# Patient Record
Sex: Female | Born: 1955 | Race: White | Hispanic: No | Smoking: Never smoker
Health system: Southern US, Community
[De-identification: ages and names within clinical notes are randomized; demographics above are authoritative.]

## PROBLEM LIST (undated history)

## (undated) DIAGNOSIS — I10 Essential (primary) hypertension: Secondary | ICD-10-CM

## (undated) DIAGNOSIS — E7800 Pure hypercholesterolemia, unspecified: Secondary | ICD-10-CM

## (undated) DIAGNOSIS — F419 Anxiety disorder, unspecified: Secondary | ICD-10-CM

## (undated) HISTORY — PX: SHOULDER EXAMINAITON UNDER ANESTHESIA W/ INJECTION: SUR467

## (undated) HISTORY — PX: ABDOMINAL HYSTERECTOMY: SHX81

## (undated) HISTORY — PX: VAGINAL HYSTERECTOMY: SUR661

---

## 2019-04-10 DIAGNOSIS — E669 Obesity, unspecified: Secondary | ICD-10-CM | POA: Diagnosis not present

## 2019-04-10 DIAGNOSIS — Z0189 Encounter for other specified special examinations: Secondary | ICD-10-CM | POA: Diagnosis not present

## 2019-04-10 DIAGNOSIS — I10 Essential (primary) hypertension: Secondary | ICD-10-CM | POA: Diagnosis not present

## 2019-04-10 DIAGNOSIS — R7301 Impaired fasting glucose: Secondary | ICD-10-CM | POA: Diagnosis not present

## 2019-04-10 DIAGNOSIS — E785 Hyperlipidemia, unspecified: Secondary | ICD-10-CM | POA: Diagnosis not present

## 2019-04-17 DIAGNOSIS — E782 Mixed hyperlipidemia: Secondary | ICD-10-CM | POA: Diagnosis not present

## 2019-04-17 DIAGNOSIS — I129 Hypertensive chronic kidney disease with stage 1 through stage 4 chronic kidney disease, or unspecified chronic kidney disease: Secondary | ICD-10-CM | POA: Diagnosis not present

## 2019-04-17 DIAGNOSIS — Z0000 Encounter for general adult medical examination without abnormal findings: Secondary | ICD-10-CM | POA: Diagnosis not present

## 2019-04-17 DIAGNOSIS — N182 Chronic kidney disease, stage 2 (mild): Secondary | ICD-10-CM | POA: Diagnosis not present

## 2019-10-17 DIAGNOSIS — E782 Mixed hyperlipidemia: Secondary | ICD-10-CM | POA: Diagnosis not present

## 2019-10-17 DIAGNOSIS — E785 Hyperlipidemia, unspecified: Secondary | ICD-10-CM | POA: Diagnosis not present

## 2019-10-17 DIAGNOSIS — N182 Chronic kidney disease, stage 2 (mild): Secondary | ICD-10-CM | POA: Diagnosis not present

## 2019-10-17 DIAGNOSIS — I10 Essential (primary) hypertension: Secondary | ICD-10-CM | POA: Diagnosis not present

## 2019-10-20 DIAGNOSIS — D225 Melanocytic nevi of trunk: Secondary | ICD-10-CM | POA: Diagnosis not present

## 2019-10-20 DIAGNOSIS — I129 Hypertensive chronic kidney disease with stage 1 through stage 4 chronic kidney disease, or unspecified chronic kidney disease: Secondary | ICD-10-CM | POA: Diagnosis not present

## 2019-10-20 DIAGNOSIS — Z85828 Personal history of other malignant neoplasm of skin: Secondary | ICD-10-CM | POA: Diagnosis not present

## 2019-10-20 DIAGNOSIS — D1801 Hemangioma of skin and subcutaneous tissue: Secondary | ICD-10-CM | POA: Diagnosis not present

## 2019-10-20 DIAGNOSIS — L821 Other seborrheic keratosis: Secondary | ICD-10-CM | POA: Diagnosis not present

## 2019-10-20 DIAGNOSIS — Z0000 Encounter for general adult medical examination without abnormal findings: Secondary | ICD-10-CM | POA: Diagnosis not present

## 2019-10-20 DIAGNOSIS — N182 Chronic kidney disease, stage 2 (mild): Secondary | ICD-10-CM | POA: Diagnosis not present

## 2019-10-20 DIAGNOSIS — E782 Mixed hyperlipidemia: Secondary | ICD-10-CM | POA: Diagnosis not present

## 2020-05-17 ENCOUNTER — Ambulatory Visit
Admission: EM | Admit: 2020-05-17 | Discharge: 2020-05-17 | Disposition: A | Discharge status: Home / Self Care | Payer: Medicare Other | Source: Home / Self Care

## 2020-05-17 ENCOUNTER — Other Ambulatory Visit: Payer: Self-pay

## 2020-05-17 ENCOUNTER — Encounter (HOSPITAL_COMMUNITY): Payer: Self-pay

## 2020-05-17 ENCOUNTER — Emergency Department (HOSPITAL_COMMUNITY)
Admission: EM | Admit: 2020-05-17 | Discharge: 2020-05-18 | Disposition: A | Discharge status: Home / Self Care | Payer: Medicare Other | Attending: Emergency Medicine | Admitting: Emergency Medicine

## 2020-05-17 DIAGNOSIS — R0789 Other chest pain: Secondary | ICD-10-CM | POA: Diagnosis not present

## 2020-05-17 DIAGNOSIS — I10 Essential (primary) hypertension: Secondary | ICD-10-CM | POA: Diagnosis not present

## 2020-05-17 DIAGNOSIS — F419 Anxiety disorder, unspecified: Secondary | ICD-10-CM | POA: Insufficient documentation

## 2020-05-17 DIAGNOSIS — Z79899 Other long term (current) drug therapy: Secondary | ICD-10-CM | POA: Diagnosis not present

## 2020-05-17 DIAGNOSIS — R4589 Other symptoms and signs involving emotional state: Secondary | ICD-10-CM

## 2020-05-17 DIAGNOSIS — R6889 Other general symptoms and signs: Secondary | ICD-10-CM | POA: Diagnosis not present

## 2020-05-17 HISTORY — DX: Essential (primary) hypertension: I10

## 2020-05-17 NOTE — Discharge Instructions (Signed)
Recommending further evaluation and management in the ED for "not feeling well, feeling like I'm going to pass out."  Would like to deferred testing to ED including EKG.  Patient and family aware of plan and in agreement.  Will travel by private vehicle to [facility] ED.

## 2020-05-17 NOTE — ED Triage Notes (Signed)
Pt was seen by PCP  on 4/29 for similar episode and had lab work done. Pt states that that she begins feeling bad after eating and reports both arms feel tingly. Pt reports her husband passed away 2 months ago

## 2020-05-17 NOTE — ED Triage Notes (Signed)
Pt states she went to urgent care earlier today for "General ill feeling",  States they did an ekg and was told to come to e.d. if she desired further treatment and testing.   Pt has multiple complaints, states she felt like she was going to pass out, got tingling feeling in both arms.  Pt denies pain at this time.  Pt states she has had a lot of stress recently due to losing her husband.

## 2020-05-17 NOTE — ED Provider Notes (Signed)
[facility]   [id number] 05/17/20 Arrival Time: [id number]  CC: "not feeling well"  SUBJECTIVE: HPI: Patient is a poor historian Haley Morrow is a 64 y.o. female who complains of not feeling good, and feeling like she was going to pass out earlier today.  Also mentions bilateral arms tingling and numbness that has occurred 4x over the past 2 years.  Has been seen by PCP for this problem and had a negative work-up.  Told symptoms may be related to anxiety.  Husband passed away 2 months ago.  Currently not taking medications.  Symptoms are made worse with eating.  Complains of associate belching with eating.  Patient denies pain, fever, chills, nausea, vomiting, chest pain, SOB, abdominal pain, changes in bowel or bladder habits.    ROS: As per HPI.  All other pertinent ROS negative.     Past Medical History:  Diagnosis Date  . Hypertension    Past Surgical History:  Procedure Laterality Date  . ABDOMINAL HYSTERECTOMY    . SHOULDER EXAMINAITON UNDER ANESTHESIA W/ INJECTION Left    No Known Allergies No current facility-administered medications on file prior to encounter.   Current Outpatient Medications on File Prior to Encounter  Medication Sig Dispense Refill  . losartan (COZAAR) 100 MG tablet Take 100 mg by mouth daily.    . rosuvastatin (CRESTOR) 10 MG tablet Take 10 mg by mouth at bedtime.     Social History   Socioeconomic History  . Marital status: Widowed    Spouse name: Not on file  . Number of children: Not on file  . Years of education: Not on file  . Highest education level: Not on file  Occupational History  . Not on file  Tobacco Use  . Smoking status: Never Smoker  . Smokeless tobacco: Never Used  Substance and Sexual Activity  . Alcohol use: Never  . Drug use: Never  . Sexual activity: Not on file  Other Topics Concern  . Not on file  Social History Narrative  . Not on file   Social Determinants of Health   Financial Resource Strain:   .  Difficulty of Paying Living Expenses:   Food Insecurity:   . Worried About Charity fundraiser in the Last Year:   . Arboriculturist in the Last Year:   Transportation Needs:   . Film/video editor (Medical):   Marland Kitchen Lack of Transportation (Non-Medical):   Physical Activity:   . Days of Exercise per Week:   . Minutes of Exercise per Session:   Stress:   . Feeling of Stress :   Social Connections:   . Frequency of Communication with Friends and Family:   . Frequency of Social Gatherings with Friends and Family:   . Attends Religious Services:   . Active Member of Clubs or Organizations:   . Attends Archivist Meetings:   Marland Kitchen Marital Status:   Intimate Partner Violence:   . Fear of Current or Ex-Partner:   . Emotionally Abused:   Marland Kitchen Physically Abused:   . Sexually Abused:    Family History  Problem Relation Age of Onset  . CAD Mother   . Cancer Mother   . Diabetes Father   . Stroke Father     OBJECTIVE:  Vitals:   05/17/20 1618  BP: 126/73  Pulse: 94  Resp: 18  Temp: 98.2 F (36.8 C)  SpO2: 98%    General appearance: alert; no distress Eyes: PERRLA; EOMI HENT:  normocephalic; atraumatic Neck: supple with FROM Lungs: clear to auscultation bilaterally Heart: regular rate and rhythm.   Extremities: no edema; symmetrical with no gross deformities Skin: warm and dry Neurologic: CN 2-12 grossly intact; finger to nose without difficulty; normal gait; strength and sensation intact bilaterally about the upper and lower extremities; negative pronator drift Psychological: alert and cooperative; normal mood and affect  ASSESSMENT & PLAN:  1. Sense of impending doom   2. General ill feeling    Recommending further evaluation and management in the ED for "not feeling well, feeling like I'm going to pass out."  Would like to deferred testing to ED including EKG.  Patient and family aware of plan and in agreement.  Will travel by private vehicle to [facility] ED.       Lestine Box, PA-C 05/17/20 1743

## 2020-05-18 ENCOUNTER — Emergency Department (HOSPITAL_COMMUNITY): Payer: Medicare Other

## 2020-05-18 LAB — COMPREHENSIVE METABOLIC PANEL
ALT: 14 U/L (ref 0–44)
AST: 15 U/L (ref 15–41)
Albumin: 4 g/dL (ref 3.5–5.0)
Alkaline Phosphatase: 64 U/L (ref 38–126)
Anion gap: 11 (ref 5–15)
BUN: 11 mg/dL (ref 8–23)
CO2: 24 mmol/L (ref 22–32)
Calcium: 8.7 mg/dL — ABNORMAL LOW (ref 8.9–10.3)
Chloride: 105 mmol/L (ref 98–111)
Creatinine, Ser: 0.72 mg/dL (ref 0.44–1.00)
GFR calc Af Amer: >60 mL/min (ref >60)
GFR calc non Af Amer: >60 mL/min (ref >60)
Glucose, Bld: 105 mg/dL — ABNORMAL HIGH (ref 70–99)
Potassium: 3.4 mmol/L — ABNORMAL LOW (ref 3.5–5.1)
Sodium: 140 mmol/L (ref 135–145)
Total Bilirubin: 0.8 mg/dL (ref 0.3–1.2)
Total Protein: 6.6 g/dL (ref 6.5–8.1)

## 2020-05-18 LAB — CBC WITH DIFFERENTIAL/PLATELET
Abs Immature Granulocytes: 0.02 10*3/uL (ref 0.00–0.07)
Basophils Absolute: 0.1 10*3/uL (ref 0.0–0.1)
Basophils Relative: 1 %
Eosinophils Absolute: 0.1 10*3/uL (ref 0.0–0.5)
Eosinophils Relative: 1 %
HCT: 41.7 % (ref 36.0–46.0)
Hemoglobin: 14.4 g/dL (ref 12.0–15.0)
Immature Granulocytes: 0 %
Lymphocytes Relative: 26 %
Lymphs Abs: 2 10*3/uL (ref 0.7–4.0)
MCH: 32.2 pg (ref 26.0–34.0)
MCHC: 34.5 g/dL (ref 30.0–36.0)
MCV: 93.3 fL (ref 80.0–100.0)
Monocytes Absolute: 0.6 10*3/uL (ref 0.1–1.0)
Monocytes Relative: 8 %
Neutro Abs: 5 10*3/uL (ref 1.7–7.7)
Neutrophils Relative %: 64 %
Platelets: 237 10*3/uL (ref 150–400)
RBC: 4.47 MIL/uL (ref 3.87–5.11)
RDW: 12.3 % (ref 11.5–15.5)
WBC: 7.8 10*3/uL (ref 4.0–10.5)
nRBC: 0 % (ref 0.0–0.2)

## 2020-05-18 LAB — LIPASE, BLOOD: Lipase: 21 U/L (ref 11–51)

## 2020-05-18 LAB — TROPONIN I (HIGH SENSITIVITY)
Troponin I (High Sensitivity): 3 ng/L (ref <18)
Troponin I (High Sensitivity): 4 ng/L (ref <18)

## 2020-05-18 NOTE — ED Provider Notes (Signed)
[facility] EMERGENCY DEPARTMENT Provider Note   CSN: [id number] Arrival date & time: 05/17/20  2318     History Chief Complaint  Patient presents with  . Anxiety    Haley Morrow is a 64 y.o. female.  Patient with a history of hypertension and high cholesterol and seasonal allergies presenting at the request of urgent care.  States she went to urgent care earlier today with feeling unwell and anxious.  She woke up this morning having to deal with some of her husband's affairs.  Her husband passed away about 2 months ago.  She began to feel anxious this morning and felt like she was "going to pass out".  She had intermittent "cold sensation" in her bilateral upper arms coming and going lasting for few minutes at a time.  There is no chest pain or shortness of breath.  There is no focal weakness, numbness or tingling.  She reports having a brief sensation of epigastric pain and fullness earlier today which is since resolved.  Never did have chest pain.  No nausea, vomiting, diaphoresis, fever or cough.  Denies any cardiac history.  Reports having a stress test about 4 to 5 years ago that was negative.  She denies any suicidal or homicidal thoughts.  She is not hearing any voices.  Denies any alcohol or drug abuse.  Patient has had similar episodes in the past with a cool sensation in her extremities and been diagnosed with anxiety.  Does not take an anxiety medication.  She was told to come to the ED by urgent care because of some nonspecific changes on her EKG.  The history is provided by the patient.  Anxiety Associated symptoms include abdominal pain. Pertinent negatives include no chest pain, no headaches and no shortness of breath.       Past Medical History:  Diagnosis Date  . Hypertension     There are no problems to display for this patient.   Past Surgical History:  Procedure Laterality Date  . ABDOMINAL HYSTERECTOMY    . SHOULDER EXAMINAITON UNDER ANESTHESIA W/  INJECTION Left      OB History   No obstetric history on file.     Family History  Problem Relation Age of Onset  . CAD Mother   . Cancer Mother   . Diabetes Father   . Stroke Father     Social History   Tobacco Use  . Smoking status: Never Smoker  . Smokeless tobacco: Never Used  Substance Use Topics  . Alcohol use: Never  . Drug use: Never    Home Medications Prior to Admission medications   Medication Sig Start Date End Date Taking? Authorizing Provider  losartan (COZAAR) 100 MG tablet Take 100 mg by mouth daily. 04/26/20   [provider]  rosuvastatin (CRESTOR) 10 MG tablet Take 10 mg by mouth at bedtime. 04/26/20   [provider]    Allergies    Patient has no known allergies.  Review of Systems   Review of Systems  Constitutional: Negative for activity change, appetite change, fatigue and fever.  HENT: Negative for congestion and rhinorrhea.   Eyes: Negative for visual disturbance.  Respiratory: Negative for cough, chest tightness and shortness of breath.   Cardiovascular: Negative for chest pain.  Gastrointestinal: Positive for abdominal pain.  Genitourinary: Negative for dysuria and hematuria.  Musculoskeletal: Negative for arthralgias and myalgias.  Skin: Negative for rash.  Neurological: Positive for light-headedness. Negative for dizziness, weakness and headaches.  Psychiatric/Behavioral:  The patient is nervous/anxious.    all other systems are negative except as noted in the HPI and PMH.    Physical Exam Updated Vital Signs BP (!) 161/85 (BP Location: Right Arm)   Pulse 83   Temp 98.1 F (36.7 C) (Oral)   Resp 17   Ht 5\' 3"  (1.6 m)   Wt 77.7 kg   SpO2 99%   BMI 30.34 kg/m   Physical Exam Vitals and nursing note reviewed.  Constitutional:      General: She is not in acute distress.    Appearance: She is well-developed.     Comments: Anxious appearing  HENT:     Head: Normocephalic and atraumatic.     Mouth/Throat:      Pharynx: No oropharyngeal exudate.  Eyes:     Conjunctiva/sclera: Conjunctivae normal.     Pupils: Pupils are equal, round, and reactive to light.  Neck:     Comments: No meningismus. Cardiovascular:     Rate and Rhythm: Normal rate and regular rhythm.     Heart sounds: Normal heart sounds. No murmur.  Pulmonary:     Effort: Pulmonary effort is normal. No respiratory distress.     Breath sounds: Normal breath sounds.  Abdominal:     Palpations: Abdomen is soft.     Tenderness: There is no abdominal tenderness. There is no guarding or rebound.  Musculoskeletal:        General: No tenderness. Normal range of motion.     Cervical back: Normal range of motion and neck supple.  Skin:    General: Skin is warm.     Capillary Refill: Capillary refill takes less than 2 seconds.     Findings: No erythema or rash.  Neurological:     General: No focal deficit present.     Mental Status: She is alert and oriented to person, place, and time. Mental status is at baseline.     Cranial Nerves: No cranial nerve deficit.     Motor: No abnormal muscle tone.     Coordination: Coordination normal.     Comments:  5/5 strength throughout. CN 2-12 intact.Equal grip strength.   Psychiatric:        Behavior: Behavior normal.     ED Results / Procedures / Treatments   Labs (all labs ordered are listed, but only abnormal results are displayed) Labs Reviewed  COMPREHENSIVE METABOLIC PANEL - Abnormal; Notable for the following components:      Result Value   Potassium 3.4 (*)    Glucose, Bld 105 (*)    Calcium 8.7 (*)    All other components within normal limits  CBC WITH DIFFERENTIAL/PLATELET  LIPASE, BLOOD  TROPONIN I (HIGH SENSITIVITY)  TROPONIN I (HIGH SENSITIVITY)    EKG None    Radiology DG Chest 2 View  Result Date: 05/18/2020 CLINICAL DATA:  Chest pain EXAM: CHEST - 2 VIEW COMPARISON:  None. FINDINGS: The heart size and mediastinal contours are within normal limits. Both lungs are  clear. The visualized skeletal structures are unremarkable. IMPRESSION: No active cardiopulmonary disease. Electronically Signed   By: Prudencio Pair M.D.   On: 05/18/2020 01:50    Procedures Procedures (including critical care time)  Medications Ordered in ED Medications - No data to display  ED Course  I have reviewed the triage vital signs and the nursing notes.  Pertinent labs & imaging results that were available during my care of the patient were reviewed by me and considered in my medical  decision making (see chart for details).    MDM Rules/Calculators/A&P                     Patient from urgent care with feeling anxious, cold sensation on bilateral upper extremities, lightheadedness, intermittent epigastric pain which has since resolved.  No chest pain or shortness of breath.  Her EKG from urgent care shows nonspecific ST changes with no comparison.  Labs are reassuring.  EKG is nonischemic.  Troponin is negative.  Patient does appear quite anxious.  She states the sensation of the cold feeling in her arms has resolved.  She has no further epigastric pain. She has intact distal pulses and grip strength  Troponin remains negative x2. No difficulty breathing or difficulty swallowing.  Low suspicion for ACS, PE, or dissection.  Patient appears stable to follow-up with her PCP. Bilateral arm sensation is atypical for stroke or TIA.  Patient feels back to baseline.  No dizziness or lightheadedness.  No chest pain or shortness of breath.  Reassuring that her EKG is sinus rhythm and troponins remain negative x2.  Low suspicion for ACS.  Suspect component of anxiety contributing to her symptoms. Establish care with PCP.  Return precautions discussed including exertional chest pain, shortness of breath, nausea, vomiting, diaphoresis or other concerns.    ED ECG REPORT   Date: 05/18/2020  Rate: 67  Rhythm: normal sinus rhythm  QRS Axis: normal  Intervals: normal  ST/T Wave  abnormalities: normal  Conduction Disutrbances:none  Narrative Interpretation:   Old EKG Reviewed: unchanged  I have personally reviewed the EKG tracing and agree with the computerized printout as noted.  Final Clinical Impression(s) / ED Diagnoses Final diagnoses:  Anxiety  Atypical chest pain    Rx / DC Orders ED Discharge Orders    None       Job Holtsclaw, Annie Main, MD 05/18/20 [phone]

## 2020-05-18 NOTE — Discharge Instructions (Signed)
Your testing is reassuring.  Follow-up with your doctor.  Return to the ED if your chest pain becomes exertional, associated shortness of breath, nausea, vomiting, sweating, any other concerns.

## 2020-05-25 ENCOUNTER — Other Ambulatory Visit: Payer: Self-pay

## 2020-05-25 ENCOUNTER — Emergency Department (HOSPITAL_COMMUNITY): Payer: Medicare Other

## 2020-05-25 ENCOUNTER — Encounter (HOSPITAL_COMMUNITY): Payer: Self-pay | Admitting: Emergency Medicine

## 2020-05-25 ENCOUNTER — Emergency Department (HOSPITAL_COMMUNITY)
Admission: EM | Admit: 2020-05-25 | Discharge: 2020-05-25 | Disposition: A | Discharge status: Home / Self Care | Payer: Medicare Other | Attending: Emergency Medicine | Admitting: Emergency Medicine

## 2020-05-25 DIAGNOSIS — R1013 Epigastric pain: Secondary | ICD-10-CM | POA: Insufficient documentation

## 2020-05-25 DIAGNOSIS — R911 Solitary pulmonary nodule: Secondary | ICD-10-CM | POA: Diagnosis not present

## 2020-05-25 DIAGNOSIS — R072 Precordial pain: Secondary | ICD-10-CM

## 2020-05-25 DIAGNOSIS — I10 Essential (primary) hypertension: Secondary | ICD-10-CM | POA: Diagnosis not present

## 2020-05-25 HISTORY — DX: Pure hypercholesterolemia, unspecified: E78.00

## 2020-05-25 LAB — CBC WITH DIFFERENTIAL/PLATELET
Abs Immature Granulocytes: 0.01 10*3/uL (ref 0.00–0.07)
Basophils Absolute: 0 10*3/uL (ref 0.0–0.1)
Basophils Relative: 1 %
Eosinophils Absolute: 0 10*3/uL (ref 0.0–0.5)
Eosinophils Relative: 0 %
HCT: 43.7 % (ref 36.0–46.0)
Hemoglobin: 15.4 g/dL — ABNORMAL HIGH (ref 12.0–15.0)
Immature Granulocytes: 0 %
Lymphocytes Relative: 16 %
Lymphs Abs: 1.3 10*3/uL (ref 0.7–4.0)
MCH: 32.4 pg (ref 26.0–34.0)
MCHC: 35.2 g/dL (ref 30.0–36.0)
MCV: 91.8 fL (ref 80.0–100.0)
Monocytes Absolute: 0.5 10*3/uL (ref 0.1–1.0)
Monocytes Relative: 6 %
Neutro Abs: 5.9 10*3/uL (ref 1.7–7.7)
Neutrophils Relative %: 77 %
Platelets: 277 10*3/uL (ref 150–400)
RBC: 4.76 MIL/uL (ref 3.87–5.11)
RDW: 12.3 % (ref 11.5–15.5)
WBC: 7.7 10*3/uL (ref 4.0–10.5)
nRBC: 0 % (ref 0.0–0.2)

## 2020-05-25 LAB — HEPATIC FUNCTION PANEL
ALT: 14 U/L (ref 0–44)
AST: 17 U/L (ref 15–41)
Albumin: 4.3 g/dL (ref 3.5–5.0)
Alkaline Phosphatase: 67 U/L (ref 38–126)
Bilirubin, Direct: 0.1 mg/dL (ref 0.0–0.2)
Indirect Bilirubin: 0.7 mg/dL (ref 0.3–0.9)
Total Bilirubin: 0.8 mg/dL (ref 0.3–1.2)
Total Protein: 7.1 g/dL (ref 6.5–8.1)

## 2020-05-25 LAB — BASIC METABOLIC PANEL
Anion gap: 12 (ref 5–15)
BUN: 8 mg/dL (ref 8–23)
CO2: 23 mmol/L (ref 22–32)
Calcium: 9.2 mg/dL (ref 8.9–10.3)
Chloride: 106 mmol/L (ref 98–111)
Creatinine, Ser: 0.72 mg/dL (ref 0.44–1.00)
GFR calc Af Amer: >60 mL/min (ref >60)
GFR calc non Af Amer: >60 mL/min (ref >60)
Glucose, Bld: 138 mg/dL — ABNORMAL HIGH (ref 70–99)
Potassium: 3.8 mmol/L (ref 3.5–5.1)
Sodium: 141 mmol/L (ref 135–145)

## 2020-05-25 LAB — TROPONIN I (HIGH SENSITIVITY): Troponin I (High Sensitivity): 3 ng/L (ref <18)

## 2020-05-25 LAB — LIPASE, BLOOD: Lipase: 23 U/L (ref 11–51)

## 2020-05-25 MED ORDER — IOHEXOL 300 MG/ML  SOLN
100.0000 mL | Freq: Once | INTRAMUSCULAR | Status: AC | PRN
  Administered 2020-05-25: 100 mL via INTRAVENOUS

## 2020-05-25 MED ORDER — PANTOPRAZOLE SODIUM 40 MG PO TBEC
40.0000 mg | DELAYED_RELEASE_TABLET | Freq: Every day | ORAL | 0 refills | Status: DC
Start: 2020-05-25 — End: 2020-05-27

## 2020-05-25 NOTE — ED Provider Notes (Signed)
Emergency Department Provider Note   I have reviewed the triage vital signs and the nursing notes.   HISTORY  Chief Complaint Abdominal Pain   HPI Haley Morrow is a 64 y.o. female with HLD and HTN presents to the ED with returns to the emergency department with episodic symptoms largely blamed on anxiety to this point.  Patient has been seen in the emergency department as well as her primary care doctor's office.  She lost her husband 2 months ago and states that she has been grieving appropriately but does not feel overly anxious, depressed.  She reports episodes where she will have burning across her chest without shortness of breath.  No fever or cough.  Symptoms seem to arise out of nowhere.  She has more persistent epigastric abdominal discomfort.  She states it feels like a "gnawing" or "tightness" with occasional radiation up into the chest.  No diaphoresis, nausea, vomiting.  No diarrhea.  No abdominal or back/flank pain.  She was prescribed Zoloft which she has taken twice but after reviewing some of the side effects is not sure if she will continue taking it. Denies prior history of ACS.   Past Medical History:  Diagnosis Date  . High cholesterol   . Hypertension     There are no problems to display for this patient.   Past Surgical History:  Procedure Laterality Date  . ABDOMINAL HYSTERECTOMY    . SHOULDER EXAMINAITON UNDER ANESTHESIA W/ INJECTION Left     Allergies Patient has no known allergies.  Family History  Problem Relation Age of Onset  . CAD Mother   . Cancer Mother   . Diabetes Father   . Stroke Father     Social History Social History   Tobacco Use  . Smoking status: Never Smoker  . Smokeless tobacco: Never Used  Substance Use Topics  . Alcohol use: Never  . Drug use: Never    Review of Systems  Constitutional: No fever/chills Eyes: No visual changes. ENT: No sore throat. Cardiovascular: Positive chest pain. Respiratory: Denies  shortness of breath. Gastrointestinal: Positive epigastric abdominal pain.  No nausea, no vomiting.  No diarrhea.  No constipation. Genitourinary: Negative for dysuria. Musculoskeletal: Negative for back pain. Skin: Negative for rash. Neurological: Negative for headaches, focal weakness or numbness.  10-point ROS otherwise negative.  ____________________________________________   PHYSICAL EXAM:  VITAL SIGNS: ED Triage Vitals  Enc Vitals Group     BP 05/25/20 1318 (!) 174/84     Pulse Rate 05/25/20 1318 87     Resp 05/25/20 1318 18     Temp 05/25/20 1318 98.4 F (36.9 C)     Temp Source 05/25/20 1318 Oral     SpO2 05/25/20 1318 98 %     Weight 05/25/20 1319 171 lb 4.8 oz (77.7 kg)     Height 05/25/20 1319 5\' 3"  (1.6 m)   Constitutional: Alert and oriented. Well appearing and in no acute distress. Eyes: Conjunctivae are normal.  Head: Atraumatic. Nose: No congestion/rhinnorhea. Mouth/Throat: Mucous membranes are moist.   Neck: No stridor.  Cardiovascular: Normal rate, regular rhythm. Good peripheral circulation. Grossly normal heart sounds.   Respiratory: Normal respiratory effort.  No retractions. Lungs CTAB. Gastrointestinal: Soft and nontender. No RUQ tenderness. No rebound or guarding. No distention.  Musculoskeletal: No gross deformities of extremities. Neurologic:  Normal speech and language.  Skin:  Skin is warm, dry and intact. No rash noted.  ____________________________________________   LABS (all labs ordered are listed,  but only abnormal results are displayed)  Labs Reviewed  CBC WITH DIFFERENTIAL/PLATELET - Abnormal; Notable for the following components:      Result Value   Hemoglobin 15.4 (*)    All other components within normal limits  BASIC METABOLIC PANEL - Abnormal; Notable for the following components:   Glucose, Bld 138 (*)    All other components within normal limits  HEPATIC FUNCTION PANEL  LIPASE, BLOOD  TROPONIN I (HIGH SENSITIVITY)    ____________________________________________  EKG   EKG Interpretation  Date/Time:  Saturday May 25 2020 13:15:35 EDT Ventricular Rate:  88 PR Interval:    QRS Duration: 81 QT Interval:  346 QTC Calculation: 419 R Axis:   22 Text Interpretation: Sinus rhythm Abnormal R-wave progression, early transition Borderline ST depression, anterolateral leads Baseline wander in lead(s) II aVR aVF No STEMI Confirmed by Nanda Quinton [phone]) on 05/25/2020 1:26:08 PM       ____________________________________________  RADIOLOGY  DG Chest 2 View  Result Date: 05/25/2020 CLINICAL DATA:  PT c/o continued epigastric pain worsening after eating x9 days. PT states normal BM this am and denies any urinary symptoms or n/v/d. PT states at times pain feels like it radiates up into her chest but not currently. EXAM: CHEST - 2 VIEW COMPARISON:  05/18/2020 FINDINGS: Lungs are clear. Heart size and mediastinal contours are within normal limits. No effusion.  No pneumothorax. Visualized bones unremarkable. IMPRESSION: No acute cardiopulmonary disease. Electronically Signed   By: Lucrezia Europe M.D.   On: 05/25/2020 15:07   CT ABDOMEN PELVIS W CONTRAST  Result Date: 05/25/2020 CLINICAL DATA:  Epigastric pain which is worse after eating x9 days. EXAM: CT ABDOMEN AND PELVIS WITH CONTRAST TECHNIQUE: Multidetector CT imaging of the abdomen and pelvis was performed using the standard protocol following bolus administration of intravenous contrast. CONTRAST:  142mL OMNIPAQUE IOHEXOL 300 MG/ML  SOLN COMPARISON:  None. FINDINGS: Lower chest: A 5 mm noncalcified lung nodule is seen within the posteromedial aspect of the right lung base. Hepatobiliary: No focal liver abnormality is seen. No gallstones, gallbladder wall thickening, or biliary dilatation. Pancreas: Unremarkable. No pancreatic ductal dilatation or surrounding inflammatory changes. Spleen: Normal in size without focal abnormality. Adrenals/Urinary Tract: Adrenal glands  are unremarkable. Kidneys are normal in size, without renal calculi or hydronephrosis. Small bilateral parapelvic renal cysts are noted. Bladder is unremarkable. Stomach/Bowel: Stomach is within normal limits. Appendix appears normal. No evidence of bowel wall thickening, distention, or inflammatory changes. Noninflamed diverticula are seen throughout the sigmoid colon. Vascular/Lymphatic: There is mild to moderate severity calcification of the abdominal aorta. No enlarged abdominal or pelvic lymph nodes. Reproductive: Status post hysterectomy. No adnexal masses. Other: No abdominal wall hernia or abnormality. No abdominopelvic ascites. Musculoskeletal: Mild multilevel degenerative changes seen throughout the lumbar spine. IMPRESSION: 1. 5 mm noncalcified lung nodule within the posteromedial aspect of the right lung base. Correlation with follow-up chest CT is recommended in 12 months. 2. Noninflamed sigmoid diverticulosis. 3. Aortic atherosclerosis. Aortic Atherosclerosis (ICD10-I70.0). Electronically Signed   By: Virgina Norfolk M.D.   On: 05/25/2020 15:12    ____________________________________________   PROCEDURES  Procedure(s) performed:   Procedures  None  ____________________________________________   INITIAL IMPRESSION / ASSESSMENT AND PLAN / ED COURSE  Pertinent labs & imaging results that were available during my care of the patient were reviewed by me and considered in my medical decision making (see chart for details).   Patient presents emergency department with symptoms of chest discomfort but her symptomatology mainly  seems to be coming from her epigastric abdominal discomfort.  She has frequent belching and burning pain radiating up into her chest.  She will likely need outpatient GI follow-up.  Plan for CT abdomen pelvis along with screening lab work.  Reviewed the labs and chest x-ray from her prior ED visit which are normal.  Patient does not have any suicidal ideation.  She  does not appear clinically depressed to me and talks about other things in her life which she is excited for including the recent birth of her grandchild.   CT imaging and lab work reviewed.  EKG interpreted as above.  Plan for GI referral which was placed in the system.  Contact information given at discharge.  We will start the patient on Protonix and have her continue to follow closely with her PCP. Discussed ED return precautions. Discussed the incidental finding of pulmonary nodule and recommended 1 year follow-up CT. patient to discuss with her PCP.  ____________________________________________  FINAL CLINICAL IMPRESSION(S) / ED DIAGNOSES  Final diagnoses:  Epigastric pain  Precordial chest pain  Pulmonary nodule     MEDICATIONS GIVEN DURING THIS VISIT:  Medications  iohexol (OMNIPAQUE) 300 MG/ML solution 100 mL (100 mLs Intravenous Contrast Given 05/25/20 1441)     NEW OUTPATIENT MEDICATIONS STARTED DURING THIS VISIT:  Discharge Medication List as of 05/25/2020  3:34 PM    START taking these medications   Details  pantoprazole (PROTONIX) 40 MG tablet Take 1 tablet (40 mg total) by mouth daily., Starting Sat 05/25/2020, Until Mon 06/24/2020, Normal        Note:  This document was prepared using Dragon voice recognition software and may include unintentional dictation errors.  Nanda Quinton, MD, [facility] Emergency Medicine    Wynona Duhamel, [facility], MD 05/26/20 [phone]

## 2020-05-25 NOTE — ED Notes (Signed)
Returned from CT.

## 2020-05-25 NOTE — Discharge Instructions (Addendum)
You have been seen in the Emergency Department (ED) for abdominal pain.  Your evaluation did not identify a clear cause of your symptoms but was generally reassuring.  Please follow up as instructed above regarding today's emergent visit and the symptoms that are bothering you.  I have placed a referral to the gastroenterologist.  Please start the medicines for reflux provided here and return to the emergency department any new or suddenly worsening symptoms especially chest pain.  Return to the ED if your abdominal pain worsens or fails to improve, you develop bloody vomiting, bloody diarrhea, you are unable to tolerate fluids due to vomiting, fever greater than 101, or other symptoms that concern you.  You do have a small pulmonary nodule found in your CT scan.  Please speak with Dr. Nevada Crane regarding this.  He can review the CT scan and send you for repeat scan in 1 year to follow this area.

## 2020-05-25 NOTE — ED Notes (Signed)
Recent loss of spouse   Here recently for abd pain   Feels that she may have a gallbladder issue   Worse after eating   Here for continued eval

## 2020-05-25 NOTE — ED Triage Notes (Signed)
PT c/o continued epigastric pain worsening after eating  x9 days. PT states normal BM this am and denies any urinary symptoms or n/v/d. PT states at times pain feels like it radiates up into her chest but not currently. PT c/o decreased appetite.

## 2020-05-27 ENCOUNTER — Other Ambulatory Visit: Payer: Self-pay

## 2020-05-27 ENCOUNTER — Ambulatory Visit (INDEPENDENT_AMBULATORY_CARE_PROVIDER_SITE_OTHER): Payer: Medicare Other | Admitting: Gastroenterology

## 2020-05-27 ENCOUNTER — Other Ambulatory Visit (INDEPENDENT_AMBULATORY_CARE_PROVIDER_SITE_OTHER): Payer: Self-pay | Admitting: *Deleted

## 2020-05-27 ENCOUNTER — Telehealth (INDEPENDENT_AMBULATORY_CARE_PROVIDER_SITE_OTHER): Payer: Self-pay | Admitting: *Deleted

## 2020-05-27 ENCOUNTER — Encounter (INDEPENDENT_AMBULATORY_CARE_PROVIDER_SITE_OTHER): Payer: Self-pay | Admitting: *Deleted

## 2020-05-27 ENCOUNTER — Encounter (INDEPENDENT_AMBULATORY_CARE_PROVIDER_SITE_OTHER): Payer: Self-pay | Admitting: Gastroenterology

## 2020-05-27 VITALS — BP 136/76 | HR 90 | Temp 97.4°F | Ht 63.0 in | Wt 167.4 lb

## 2020-05-27 DIAGNOSIS — R1013 Epigastric pain: Secondary | ICD-10-CM | POA: Diagnosis not present

## 2020-05-27 DIAGNOSIS — R634 Abnormal weight loss: Secondary | ICD-10-CM

## 2020-05-27 MED ORDER — PANTOPRAZOLE SODIUM 40 MG PO TBEC
40.0000 mg | DELAYED_RELEASE_TABLET | Freq: Two times a day (BID) | ORAL | 3 refills | Status: DC
Start: 2020-05-27 — End: 2020-09-24

## 2020-05-27 MED ORDER — SUTAB 1479-225-188 MG PO TABS: 1.0000 | ORAL_TABLET | Freq: Once | ORAL | 0 refills | Status: AC

## 2020-05-27 NOTE — Progress Notes (Signed)
Patient profile: Haley Morrow is a 64 y.o. female seen for evaluation of epigastric pain/hospital f/up.   History of Present Illness: Haley Morrow is seen today for evaluation of epigastric pain, burping, belching. Symptoms began on average 1.5 weeks ago for this epsidoe but has had similar episodes in past slightly less intense in severity.  Symptoms are associated w / limited appetite, eating does make pain epigastric worse. Feels she is only "eating enough to keep going". She denies nausea/vomiting. Denies GERD and dysphagia. Describes pain mostly as a pressure bilateral in epiastric area   Reports a similar episode of epigastric pain in 2015 that was related to anxiety.   She reports BM daily. No blood in stool, constipation, or diarrhea.   Reports 6 months ago weight was #182   Started Protonix 2 days ago after ER visit - helping a small amount. Using Maalox before protonix OTC.   Takes baby aspirin daily but very rare nsaid use (average 2x/month). Husband passed away 2 months ago.   Sister accompanies and helps w/ hx.   Wt Readings from Last 3 Encounters:  05/27/20 167 lb 6.4 oz (75.9 kg)  05/25/20 171 lb 4.8 oz (77.7 kg)  05/17/20 171 lb 4.8 oz (77.7 kg)     Last Colonoscopy: 10 years ago in [place], per patient Cologuard about 2 years ago negative.  Last Endoscopy: none prior    Past Medical History:  Past Medical History:  Diagnosis Date  . High cholesterol   . Hypertension     Problem List: There are no problems to display for this patient.   Past Surgical History: Past Surgical History:  Procedure Laterality Date  . ABDOMINAL HYSTERECTOMY    . SHOULDER EXAMINAITON UNDER ANESTHESIA W/ INJECTION Left     Allergies: No Known Allergies    Home Medications:  Current Outpatient Medications:  .  aspirin 81 MG chewable tablet, Chew by mouth daily., Disp: , Rfl:  .  Biotin w/ Vitamins C & E (HAIR/SKIN/NAILS PO), Take by mouth daily., Disp: , Rfl:  .   loratadine (CLARITIN) 10 MG tablet, Take 10 mg by mouth daily., Disp: , Rfl:  .  losartan (COZAAR) 100 MG tablet, Take 100 mg by mouth daily., Disp: , Rfl:  .  rosuvastatin (CRESTOR) 10 MG tablet, Take 10 mg by mouth at bedtime., Disp: , Rfl:  .  pantoprazole (PROTONIX) 40 MG tablet, Take 1 tablet (40 mg total) by mouth 2 (two) times daily before a meal., Disp: 60 tablet, Rfl: 3   Family History: family history includes CAD in her mother; Cancer in her mother; Diabetes in her father; Stroke in her father.    Social History:   reports that she has never smoked. She has never used smokeless tobacco. She reports that she does not drink alcohol or use drugs.   Review of Systems: Constitutional: Denies weight loss/weight gain  Eyes: No changes in vision. ENT: No oral lesions, sore throat.  GI: see HPI.  Heme/Lymph: No easy bruising.  CV: No chest pain.  GU: No hematuria.  Integumentary: No rashes.  Neuro: No headaches.  Psych: No depression/anxiety.  Endocrine: No heat/cold intolerance.  Allergic/Immunologic: No urticaria.  Resp: No cough, SOB.  Musculoskeletal: No joint swelling.    Physical Examination: BP 136/76 (BP Location: Right Arm, Patient Position: Sitting, Cuff Size: Large)   Pulse 90   Temp (!) 97.4 F (36.3 C) (Temporal)   Ht 5\' 3"  (1.6 m)   Wt 167 lb  6.4 oz (75.9 kg)   BMI 29.65 kg/m  Gen: NAD, alert and oriented x 4 HEENT: PEERLA, EOMI, Neck: supple, no JVD Chest: CTA bilaterally, no wheezes, crackles, or other adventitious sounds CV: RRR, no m/g/c/r Abd: soft, NT, ND, +BS in all four quadrants; no HSM, guarding, ridigity, or rebound tenderness Ext: no edema, well perfused with 2+ pulses, Skin: no rash or lesions noted on observed skin Lymph: no noted LAD  Data:  05/18/20-CBC w/ Hgb 15.4, CMP w/ K+ 3.4 , lipase normal. LFTS normal,   05/2020-IMPRESSION: CT a/p  1. 5 mm noncalcified lung nodule within the posteromedial aspect of the right lung base.  Correlation with follow-up chest CT is recommended in 12 months. 2. Noninflamed sigmoid diverticulosis. 3. Aortic atherosclerosis.  Assessment/Plan: Haley Morrow is a 64 y.o. female  Haley Morrow was seen today for new patient (initial visit).  Diagnoses and all orders for this visit:  Abdominal pain, epigastric -     Helicobacter pylori special antigen  Loss of weight  Other orders -     pantoprazole (PROTONIX) 40 MG tablet; Take 1 tablet (40 mg total) by mouth 2 (two) times daily before a meal.      1. Epigastric pain-work-up in ER with CT and labs unremarkable.  Suspect she has some gastritis or peptic ulcer disease.  She has had some increase in pain with Protonix once a day, she can try increasing to twice a day.  We will schedule endoscopy for evaluation  2.  Weight loss-unintentional, 15 pounds, likely due to epigastric pain as above but has been 10 years since her last colonoscopy and will add on colonoscopy to endoscopy.  She reports a negative Cologuard a few years ago.  CT was unremarkable  She denies any prior issues with sedation.   Patient denies CP, SOB, and use of blood thinners. I discussed the risks and benefits of procedure including bleeding, perforation, infection, missed lesions, medication reactions and possible hospitalization or surgery if complications. All questions answered.   I personally performed the service, non-incident to. (WP)  Haley Morrow, [facility]

## 2020-05-27 NOTE — Patient Instructions (Signed)
Try increasing protonix to twice a day. We are scheduling endoscopy/colonoscoy for evaluation. We are checking for H pylori which is a bacteria that can cause stomach ulcers

## 2020-05-27 NOTE — Telephone Encounter (Signed)
Patient needs Sutab (copay card) ° °

## 2020-06-14 ENCOUNTER — Other Ambulatory Visit (HOSPITAL_COMMUNITY)
Admission: RE | Admit: 2020-06-14 | Discharge: 2020-06-14 | Disposition: A | Discharge status: Home / Self Care | Payer: Medicare Other | Source: Ambulatory Visit | Attending: Internal Medicine | Admitting: Internal Medicine

## 2020-06-14 ENCOUNTER — Other Ambulatory Visit: Payer: Self-pay

## 2020-06-14 DIAGNOSIS — Z01812 Encounter for preprocedural laboratory examination: Secondary | ICD-10-CM | POA: Diagnosis present

## 2020-06-14 DIAGNOSIS — Z20822 Contact with and (suspected) exposure to covid-19: Secondary | ICD-10-CM | POA: Insufficient documentation

## 2020-06-15 LAB — SARS CORONAVIRUS 2 (TAT 6-24 HRS): SARS Coronavirus 2: NEGATIVE

## 2020-06-17 ENCOUNTER — Encounter (HOSPITAL_COMMUNITY)
Admission: RE | Disposition: A | Discharge status: Home / Self Care | Payer: Self-pay | Source: Home / Self Care | Attending: Internal Medicine

## 2020-06-17 ENCOUNTER — Ambulatory Visit (HOSPITAL_COMMUNITY)
Admission: RE | Admit: 2020-06-17 | Discharge: 2020-06-17 | Disposition: A | Discharge status: Home / Self Care | Payer: Medicare Other | Attending: Internal Medicine | Admitting: Internal Medicine

## 2020-06-17 ENCOUNTER — Other Ambulatory Visit: Payer: Self-pay

## 2020-06-17 ENCOUNTER — Encounter (HOSPITAL_COMMUNITY): Payer: Self-pay | Admitting: Internal Medicine

## 2020-06-17 DIAGNOSIS — E7800 Pure hypercholesterolemia, unspecified: Secondary | ICD-10-CM | POA: Diagnosis not present

## 2020-06-17 DIAGNOSIS — Z7982 Long term (current) use of aspirin: Secondary | ICD-10-CM | POA: Insufficient documentation

## 2020-06-17 DIAGNOSIS — Z800 Family history of malignant neoplasm of digestive organs: Secondary | ICD-10-CM | POA: Insufficient documentation

## 2020-06-17 DIAGNOSIS — Z79899 Other long term (current) drug therapy: Secondary | ICD-10-CM | POA: Insufficient documentation

## 2020-06-17 DIAGNOSIS — K3189 Other diseases of stomach and duodenum: Secondary | ICD-10-CM | POA: Insufficient documentation

## 2020-06-17 DIAGNOSIS — K644 Residual hemorrhoidal skin tags: Secondary | ICD-10-CM | POA: Diagnosis not present

## 2020-06-17 DIAGNOSIS — D120 Benign neoplasm of cecum: Secondary | ICD-10-CM | POA: Diagnosis not present

## 2020-06-17 DIAGNOSIS — Z8249 Family history of ischemic heart disease and other diseases of the circulatory system: Secondary | ICD-10-CM | POA: Diagnosis not present

## 2020-06-17 DIAGNOSIS — R634 Abnormal weight loss: Secondary | ICD-10-CM | POA: Diagnosis not present

## 2020-06-17 DIAGNOSIS — I10 Essential (primary) hypertension: Secondary | ICD-10-CM | POA: Insufficient documentation

## 2020-06-17 DIAGNOSIS — K5730 Diverticulosis of large intestine without perforation or abscess without bleeding: Secondary | ICD-10-CM | POA: Diagnosis not present

## 2020-06-17 DIAGNOSIS — Z833 Family history of diabetes mellitus: Secondary | ICD-10-CM | POA: Diagnosis not present

## 2020-06-17 DIAGNOSIS — R1013 Epigastric pain: Secondary | ICD-10-CM

## 2020-06-17 HISTORY — PX: ESOPHAGOGASTRODUODENOSCOPY: SHX5428

## 2020-06-17 HISTORY — PX: COLONOSCOPY: SHX5424

## 2020-06-17 HISTORY — PX: POLYPECTOMY: SHX5525

## 2020-06-17 HISTORY — PX: BIOPSY: SHX5522

## 2020-06-17 SURGERY — COLONOSCOPY
Anesthesia: Moderate Sedation

## 2020-06-17 MED ORDER — MIDAZOLAM HCL 5 MG/5ML IJ SOLN
INTRAMUSCULAR | Status: DC | PRN
  Administered 2020-06-17 (×5): 2 mg via INTRAVENOUS

## 2020-06-17 MED ORDER — MIDAZOLAM HCL 5 MG/5ML IJ SOLN
INTRAMUSCULAR | Status: AC
  Filled 2020-06-17: qty 10

## 2020-06-17 MED ORDER — LIDOCAINE VISCOUS HCL 2 % MT SOLN
OROMUCOSAL | Status: DC | PRN
  Administered 2020-06-17: 1 via OROMUCOSAL

## 2020-06-17 MED ORDER — SODIUM CHLORIDE 0.9 % IV SOLN: INTRAVENOUS | Status: DC

## 2020-06-17 MED ORDER — STERILE WATER FOR IRRIGATION IR SOLN
Status: DC | PRN
  Administered 2020-06-17: 200 mL

## 2020-06-17 MED ORDER — MEPERIDINE HCL 50 MG/ML IJ SOLN
INTRAMUSCULAR | Status: AC
  Filled 2020-06-17: qty 1

## 2020-06-17 MED ORDER — LIDOCAINE VISCOUS HCL 2 % MT SOLN
OROMUCOSAL | Status: AC
  Filled 2020-06-17: qty 15

## 2020-06-17 MED ORDER — MEPERIDINE HCL 50 MG/ML IJ SOLN
INTRAMUSCULAR | Status: DC | PRN
  Administered 2020-06-17 (×4): 25 mg

## 2020-06-17 NOTE — Op Note (Signed)
[facility] Patient Name: Haley Morrow Procedure Date: 06/17/2020 10:52 AM MRN: [id number] Date of Birth: March 04, 1956 Attending MD: Hildred Laser , MD CSN: [id number] Age: 64 Admit Type: Outpatient Procedure:                Colonoscopy Indications:              Weight loss Providers:                Hildred Laser, MD, Crystal Page, Aram Candela Referring MD:             Delphina Cahill, MD Medicines:                Midazolam 2 mg IV Complications:            No immediate complications. Estimated Blood Loss:     Estimated blood loss was minimal. Procedure:                Pre-Anesthesia Assessment:                           - Prior to the procedure, a History and Physical                            was performed, and patient medications and                            allergies were reviewed. The patient's tolerance of                            previous anesthesia was also reviewed. The risks                            and benefits of the procedure and the sedation                            options and risks were discussed with the patient.                            All questions were answered, and informed consent                            was obtained. Prior Anticoagulants: The patient has                            taken no previous anticoagulant or antiplatelet                            agents except for aspirin. ASA Grade Assessment: II                            - A patient with mild systemic disease. After                            reviewing the risks and benefits, the patient was  deemed in satisfactory condition to undergo the                            procedure.                           After obtaining informed consent, the colonoscope                            was passed under direct vision. Throughout the                            procedure, the patient's blood pressure, pulse, and                            oxygen saturations were  monitored continuously. The                            [id number]-H190DL ([id number]) scope was introduced through                            the anus and advanced to the the cecum, identified                            by appendiceal orifice and ileocecal valve. The                            colonoscopy was performed without difficulty. The                            patient tolerated the procedure well. The quality                            of the bowel preparation was good except the cecum                            was fair. The ileocecal valve, appendiceal orifice,                            and rectum were photographed. Scope In: 10:54:15 AM Scope Out: 11:03:53 AM Scope Withdrawal Time: 0 hours 6 minutes 42 seconds  Total Procedure Duration: 0 hours 9 minutes 38 seconds  Findings:      The perianal and digital rectal examinations were normal.      A diminutive polyp was found in the cecum. The polyp was sessile.       Biopsies were taken with a cold forceps for histology. The pathology       specimen was placed into Bottle Number 2.      Diverticula were found in the sigmoid colon.      External hemorrhoids were found during retroflexion. The hemorrhoids       were small. Impression:               - One diminutive polyp in the cecum. Biopsied.                           -  Diverticulosis in the sigmoid colon.                           - External hemorrhoids. Moderate Sedation:      Moderate (conscious) sedation was administered by the endoscopy nurse       and supervised by the endoscopist. The following parameters were       monitored: oxygen saturation, heart rate, blood pressure, CO2       capnography and response to care. Total physician intraservice time was       12 minutes. Recommendation:           - Patient has a contact number available for                            emergencies. The signs and symptoms of potential                            delayed complications were discussed  with the                            patient. Return to normal activities tomorrow.                            Written discharge instructions were provided to the                            patient.                           - High fiber diet today.                           - Continue present medications.                           - No aspirin, ibuprofen, naproxen, or other                            non-steroidal anti-inflammatory drugs for 1 day.                           - Await pathology results.                           - Repeat colonoscopy is recommended. The                            colonoscopy date will be determined after pathology                            results from today's exam become available for                            review. Procedure Code(s):        --- Professional ---  45380, Colonoscopy, flexible; with biopsy, single                            or multiple                           G0500, Moderate sedation services provided by the                            same physician or other qualified health care                            professional performing a gastrointestinal                            endoscopic service that sedation supports,                            requiring the presence of an independent trained                            observer to assist in the monitoring of the                            patient's level of consciousness and physiological                            status; initial 15 minutes of intra-service time;                            patient age 89 years or older (additional time may                            be reported with [phone], as appropriate) Diagnosis Code(s):        --- Professional ---                           K63.5, Polyp of colon                           K64.4, Residual hemorrhoidal skin tags                           R63.4, Abnormal weight loss                           K57.30,  Diverticulosis of large intestine without                            perforation or abscess without bleeding CPT copyright 2019 American Medical Association. All rights reserved. The codes documented in this report are preliminary and upon coder review may  be revised to meet current compliance requirements. Hildred Laser, MD Hildred Laser, MD 06/17/2020 11:18:06 AM This report has been signed electronically. Number of Addenda: 0

## 2020-06-17 NOTE — Discharge Instructions (Signed)
No aspirin or NSAIDs for 24 hours. Resume other medications and diet as before Physician will call with results of biopsy and further recommendations.   Colonoscopy, Adult, Care After This sheet gives you information about how to care for yourself after your procedure. Your health care provider may also give you more specific instructions. If you have problems or questions, contact your health care provider.  Dr Laural Golden:  [phone]  What can I expect after the procedure? After the procedure, it is common to have:  A small amount of blood in your stool for 24 hours after the procedure.  Some gas.  Mild cramping or bloating of your abdomen.   Follow these instructions at home: Eating and drinking   Drink enough fluid to keep your urine pale yellow.  Resume your normal diet.   Activity  Rest as told by your health care provider.   Managing cramping and bloating   Try walking around when you have cramps or feel bloated.  General instructions  For the first 24 hours after the procedure: ? Do not drive or use machinery. ? Do not sign important documents. ? Do not drink alcohol. ? Do your regular daily activities at a slower pace than normal.   Take over-the-counter and prescription medicines only as told by your health care provider.  Keep all follow-up visits as told by your health care provider. This is important.   Contact a health care provider if:  You have blood in your stool 2-3 days after the procedure. Get help right away if you have:  More than a small spotting of blood in your stool.  Large blood clots in your stool.  Swelling of your abdomen.  Nausea or vomiting.  A fever.  Increasing pain in your abdomen that is not relieved with medicine. Summary  After the procedure, it is common to have a small amount of blood in your stool. You may also have mild cramping and bloating of your abdomen.  For the first 24 hours after the procedure, do  not drive or use machinery, sign important documents, or drink alcohol.  Get help right away if you have a lot of blood in your stool, nausea or vomiting, a fever, or increased pain in your abdomen. This information is not intended to replace advice given to you by your health care provider. Make sure you discuss any questions you have with your health care provider. Document Revised: 07/03/2019 Document Reviewed: 07/03/2019 Elsevier Patient Education  [employer].   Upper Endoscopy, Adult, Care After This sheet gives you information about how to care for yourself after your procedure. Your health care provider may also give you more specific instructions. If you have problems or questions, contact your health care provider. What can I expect after the procedure? After the procedure, it is common to have:  A sore throat.  Mild stomach pain or discomfort.  Bloating.  Nausea. Follow these instructions at home:   Follow instructions from your health care provider about what to eat or drink after your procedure.  Take over-the-counter and prescription medicines only as told by your health care provider.  Do not drive for 24 hours if you were given a sedative during your procedure.  Keep all follow-up visits as told by your health care provider. This is important. Contact a health care provider if you have:  A sore throat that lasts longer than one day.  Trouble swallowing. Get help right away if:  You vomit blood or  your vomit looks like coffee grounds.  You have: ? A fever. ? Bloody, black, or tarry stools. ? A severe sore throat or you cannot swallow. ? Difficulty breathing. ? Severe pain in your chest or abdomen. Summary  After the procedure, it is common to have a sore throat, mild stomach discomfort, bloating, and nausea.  Do not drive for 24 hours if you were given a sedative during the procedure.  Follow instructions from your health care provider about  what to eat or drink after your procedure.  Return to your normal activities as told by your health care provider. This information is not intended to replace advice given to you by your health care provider. Make sure you discuss any questions you have with your health care provider. Document Revised: 05/31/2018 Document Reviewed: 05/09/2018 Elsevier Patient Education  [employer].

## 2020-06-17 NOTE — Op Note (Signed)
[facility] Patient Name: Haley Morrow Procedure Date: 06/17/2020 9:58 AM MRN: [id number] Date of Birth: Dec 06, 1956 Attending MD: Hildred Laser , MD CSN: [id number] Age: 64 Admit Type: Outpatient Procedure:                Upper GI endoscopy Indications:              Epigastric abdominal pain Providers:                Hildred Laser, MD, [place] Page Referring MD:             Delphina Cahill, MD Medicines:                Lidocaine spray, Meperidine 100 mg IV, Midazolam 8                            mg IV Complications:            No immediate complications. Estimated Blood Loss:     Estimated blood loss was minimal. Procedure:                Pre-Anesthesia Assessment:                           - Prior to the procedure, a History and Physical                            was performed, and patient medications and                            allergies were reviewed. The patient's tolerance of                            previous anesthesia was also reviewed. The risks                            and benefits of the procedure and the sedation                            options and risks were discussed with the patient.                            All questions were answered, and informed consent                            was obtained. Prior Anticoagulants: The patient has                            taken no previous anticoagulant or antiplatelet                            agents except for aspirin. ASA Grade Assessment: II                            - A patient with mild systemic disease. After  reviewing the risks and benefits, the patient was                            deemed in satisfactory condition to undergo the                            procedure.                           After obtaining informed consent, the endoscope was                            passed under direct vision. Throughout the                            procedure, the patient's blood pressure,  pulse, and                            oxygen saturations were monitored continuously. The                            GIF-H190 ([id number]) scope was introduced through the                            mouth, and advanced to the second part of duodenum.                            The upper GI endoscopy was accomplished without                            difficulty. The patient tolerated the procedure                            well. Scope In: 10:43:22 AM Scope Out: 10:51:28 AM Total Procedure Duration: 0 hours 8 minutes 6 seconds  Findings:      The hypopharynx was normal.      The examined esophagus was normal.      The Z-line was regular and was found 38 cm from the incisors.      A few erosions with no stigmata of recent bleeding were found in the       gastric antrum. Biopsies were taken with a cold forceps for histology.       The pathology specimen was placed into Bottle Number 1.      The exam of the stomach was otherwise normal.      The duodenal bulb and second portion of the duodenum were normal. Impression:               - Normal hypopharynx.                           - Normal esophagus.                           - Z-line regular, 38 cm from the incisors.                           -  Erosive gastropathy with no stigmata of recent                            bleeding. Biopsied.                           - Normal duodenal bulb and second portion of the                            duodenum. Moderate Sedation:      Moderate (conscious) sedation was administered by the endoscopy nurse       and supervised by the endoscopist. The following parameters were       monitored: oxygen saturation, heart rate, blood pressure, CO2       capnography and response to care. Total physician intraservice time was       17 minutes. Recommendation:           - Patient has a contact number available for                            emergencies. The signs and symptoms of potential                             delayed complications were discussed with the                            patient. Return to normal activities tomorrow.                            Written discharge instructions were provided to the                            patient.                           - Resume previous diet today.                           - Continue present medications.                           - No aspirin, ibuprofen, naproxen, or other                            non-steroidal anti-inflammatory drugs for 1 day.                           - Await pathology results. Procedure Code(s):        --- Professional ---                           [phone], Esophagogastroduodenoscopy, flexible,                            transoral; with biopsy, single or multiple  G0500, Moderate sedation services provided by the                            same physician or other qualified health care                            professional performing a gastrointestinal                            endoscopic service that sedation supports,                            requiring the presence of an independent trained                            observer to assist in the monitoring of the                            patient's level of consciousness and physiological                            status; initial 15 minutes of intra-service time;                            patient age 63 years or older (additional time may                            be reported with [phone], as appropriate) Diagnosis Code(s):        --- Professional ---                           K31.89, Other diseases of stomach and duodenum                           R10.13, Epigastric pain CPT copyright 2019 American Medical Association. All rights reserved. The codes documented in this report are preliminary and upon coder review may  be revised to meet current compliance requirements. Hildred Laser, MD Hildred Laser, MD 06/17/2020 11:13:38 AM This report has  been signed electronically. Number of Addenda: 0

## 2020-06-17 NOTE — H&P (Addendum)
Haley Morrow is an 64 y.o. female.   Chief Complaint: Haley Morrow is here for esophagogastroduodenoscopy and colonoscopy. HPI: Haley Morrow 64 year old Caucasian female who began to have epigastric pain and burping back in March 2021 after she lost her husband.  She also had some chest symptoms.  She has been seen in emergency room on 2 occasions.  She had abdominal pelvic CT on her second visit earlier this month.  No abnormality was noted to account for symptoms.  LFTs were normal.  She was begun on pantoprazole.  She reports epigastric pain getting better but she still burps.  Her appetite has decreased.  She has lost at least 15 pounds.  She reports no change in her bowel habits melena or rectal bleeding.  She had colonoscopy about 16 years ago.  She did have Cologuard 2 years ago and was negative. Family history significant for gastric carcinoma arising out of benign gastric ulcer in her mother at age 51.  She died within 6 months of diagnosis. Family history is negative for CRC or IBD.  Past Medical History:  Diagnosis Date  . High cholesterol   . Hypertension     Past Surgical History:  Procedure Laterality Date  . ABDOMINAL HYSTERECTOMY    . SHOULDER EXAMINAITON UNDER ANESTHESIA W/ INJECTION Left     Family History  Problem Relation Age of Onset  . CAD Mother   . Cancer Mother   . Diabetes Father   . Stroke Father    Social History:  reports that she has never smoked. She has never used smokeless tobacco. She reports that she does not drink alcohol and does not use drugs.  Allergies: No Known Allergies  Medications Prior to Admission  Medication Sig Dispense Refill  . aspirin 81 MG chewable tablet Chew 81 mg by mouth daily.     . Biotin w/ Vitamins C & E (HAIR/SKIN/NAILS PO) Take 1 tablet by mouth daily.     Marland Kitchen loratadine (CLARITIN) 10 MG tablet Take 10 mg by mouth daily.    Marland Kitchen losartan (COZAAR) 100 MG tablet Take 100 mg by mouth daily.    . pantoprazole (PROTONIX) 40 MG tablet  Take 1 tablet (40 mg total) by mouth 2 (two) times daily before a meal. 60 tablet 3  . rosuvastatin (CRESTOR) 10 MG tablet Take 10 mg by mouth every qod.      No results found for this or any previous visit (from the past 48 hour(s)). No results found.  Review of Systems  Blood pressure 103/85, pulse 80, temperature 98.9 F (37.2 C), temperature source Oral, resp. rate 18, height 5\' 3"  (1.6 m), weight 75.8 kg, SpO2 100 %. Physical Exam HENT:     Mouth/Throat:     Mouth: Mucous membranes are moist.     Pharynx: Oropharynx is clear.  Eyes:     General: No scleral icterus.    Conjunctiva/sclera: Conjunctivae normal.  Cardiovascular:     Rate and Rhythm: Normal rate and regular rhythm.     Heart sounds: Normal heart sounds. No murmur heard.   Pulmonary:     Effort: Pulmonary effort is normal.     Breath sounds: Normal breath sounds.  Abdominal:     Comments: Abdomen is symmetrical soft and nontender with organomegaly or masses.  Musculoskeletal:        General: No swelling.     Cervical back: Neck supple.  Lymphadenopathy:     Cervical: No cervical adenopathy.  Skin:    General: Skin  is warm and dry.  Neurological:     Mental Status: She is alert.  Psychiatric:        Mood and Affect: Mood normal.      Assessment/Plan  History of epigastric pain and burping. Weight loss. Diagnostic esophagogastroduodenoscopy and colonoscopy.  Hildred Laser, MD 06/17/2020, 10:27 AM

## 2020-06-18 ENCOUNTER — Other Ambulatory Visit: Payer: Self-pay

## 2020-06-18 LAB — SURGICAL PATHOLOGY

## 2020-06-19 ENCOUNTER — Encounter (HOSPITAL_COMMUNITY): Payer: Self-pay | Admitting: Internal Medicine

## 2020-07-23 ENCOUNTER — Telehealth (INDEPENDENT_AMBULATORY_CARE_PROVIDER_SITE_OTHER): Payer: Self-pay

## 2020-07-23 NOTE — Telephone Encounter (Signed)
Pt came by the office for a weight , per a  Voicemail that she said that you left for her wanting to know her current weight. Pt was weighted her was 160.0lb , pt last previous weight was 167.0lb 7 days ago.

## 2020-07-24 NOTE — Telephone Encounter (Signed)
Talked with patient. Says she is feeling better and eating better today she does not feel well because she had company and they are left and she is alone by herself. She has lost 7 pounds in the last 8 weeks. She will stop by for weight check in 4 weeks.  If symptoms change we will plan to see her in the office earlier.

## 2020-07-25 NOTE — Telephone Encounter (Signed)
Noted in reminders. 

## 2020-08-15 ENCOUNTER — Other Ambulatory Visit (INDEPENDENT_AMBULATORY_CARE_PROVIDER_SITE_OTHER): Payer: Self-pay | Admitting: *Deleted

## 2020-08-28 ENCOUNTER — Telehealth (INDEPENDENT_AMBULATORY_CARE_PROVIDER_SITE_OTHER): Payer: Self-pay

## 2020-08-28 NOTE — Telephone Encounter (Signed)
I talked with patient.  Even though she has lost more weight she feels fine.  No fever chills or night sweats. She says her weight loss is because she has change her eating habits since her husband passed away.  She does not eat potatoes and bread like she was doing before. Patient will need office visit with me in 8 weeks.

## 2020-08-28 NOTE — Telephone Encounter (Signed)
Patient came by the office today for a weight check. Her last weight here was 167 lb. Today she weighed 153.6 lbs. She is not having and problems and she is not currently taking diuretics. Please advise of any changes or recommendations.

## 2020-09-24 ENCOUNTER — Ambulatory Visit (INDEPENDENT_AMBULATORY_CARE_PROVIDER_SITE_OTHER): Payer: Medicare Other | Admitting: Internal Medicine

## 2020-09-24 ENCOUNTER — Encounter (INDEPENDENT_AMBULATORY_CARE_PROVIDER_SITE_OTHER): Payer: Self-pay | Admitting: Internal Medicine

## 2020-09-24 ENCOUNTER — Other Ambulatory Visit: Payer: Self-pay

## 2020-09-24 DIAGNOSIS — R634 Abnormal weight loss: Secondary | ICD-10-CM

## 2020-09-24 DIAGNOSIS — K219 Gastro-esophageal reflux disease without esophagitis: Secondary | ICD-10-CM

## 2020-09-24 HISTORY — DX: Abnormal weight loss: R63.4

## 2020-09-24 HISTORY — DX: Gastro-esophageal reflux disease without esophagitis: K21.9

## 2020-09-24 MED ORDER — PANTOPRAZOLE SODIUM 40 MG PO TBEC: 40.0000 mg | DELAYED_RELEASE_TABLET | Freq: Every day | ORAL | 5 refills | Status: DC

## 2020-09-24 NOTE — Patient Instructions (Addendum)
Weight check in 1 month. Decrease pantoprazole to once daily.  Take 40 mg by mouth 30 minutes before breakfast. Can try alprazolam 0.25 or half a tablet twice a day as needed instead of 0.5 mg as recommended

## 2020-09-24 NOTE — Progress Notes (Signed)
Presenting complaint;  Follow-up for GERD and weight loss.  Database and subjective:  Haley Morrow is 64 year old Caucasian female who is here for scheduled visit.  She was initially seen in March 2021 for frequent burping epigastric pain and weight loss.  Weight loss started after she lost her husband in March this year.  CBC, comprehensive chemistry panel and serum lipase were normal in May and June 2021. She underwent esophagogastroduodenoscopy and colonoscopy on 06/17/2020.  She had erosive gastritis and biopsy was negative for H. pylori.  Esophageal mucosa was normal.  Colonoscopy revealed small cecal tubular adenoma, sigmoid diverticulosis and external hemorrhoids.  Patient weight loss was felt to be decreased appetite because of trauma of losing her husband. She was begun on Zoloft and alprazolam by her physician but she ended up in emergency room after taking first dose of Zoloft.  He has been afraid to take alprazolam.  She has a prescription for 0.5 mg tablets. Review of her records revealed that she has lost 20 pounds over the last 4 months.  She has lost 16 pounds since her visit of 05/27/2020. He says she backed off on her blood pressure medication because she was having postural lightheadedness. She feels her appetite is improving.  On most days she has 2 meals per day.  She is not having burping heartburn or epigastric pain.  She says she went to the beach with a sister 2 weeks ago.  She was there for 4 days and she walked a lot and she ate 3 meals every day.  She denies nausea vomiting fever chills or profuse night sweats.  She has some at night.  Bowels move anywhere from 1-2 times a day.  Every now and then she may skip a day.  She denies melena or rectal bleeding. She is spending time with 2 of her grandchildren ages 36 and 18.  She sees them every day.  She is also involved in church activities. She says her husband died on 21-Mar-2020 at age 15 while he was at bank teller machine.  He was  resuscitated without success.   Current Medications: Outpatient Encounter Medications as of 09/24/2020  Medication Sig  . loratadine (CLARITIN) 10 MG tablet Take 10 mg by mouth daily.  Marland Kitchen losartan (COZAAR) 100 MG tablet Take 100 mg by mouth daily.  . pantoprazole (PROTONIX) 40 MG tablet Take 1 tablet (40 mg total) by mouth 2 (two) times daily before a meal.  . rosuvastatin (CRESTOR) 10 MG tablet Take 10 mg by mouth every Monday, Wednesday, and Friday.   . Biotin w/ Vitamins C & E (HAIR/SKIN/NAILS PO) Take 1 tablet by mouth daily.  (Patient not taking: Reported on 09/24/2020)  . [DISCONTINUED] aspirin 81 MG chewable tablet Chew 1 tablet (81 mg total) by mouth daily. (Patient not taking: Reported on 09/24/2020)   No facility-administered encounter medications on file as of 09/24/2020.     Objective: Blood pressure (!) 142/66, pulse 69, temperature 98.7 F (37.1 C), temperature source Oral, height 5' 3"  (1.6 m), weight 151 lb (68.5 kg). Patient is alert and in no acute distress. Patient is wearing a mask. Conjunctiva is pink. Sclera is nonicteric Oropharyngeal mucosa is normal. No neck masses or thyromegaly noted. Cardiac exam with regular rhythm normal S1 and S2. No murmur or gallop noted. Lungs are clear to auscultation. Abdomen symmetrical soft and nontender without organomegaly or masses. No LE edema or clubbing noted.  Labs/studies Results:  CBC Latest Ref Rng & Units 05/25/2020 05/18/2020  WBC  4.0 - 10.5 K/uL 7.7 7.8  Hemoglobin 12.0 - 15.0 g/dL 15.4(H) 14.4  Hematocrit 36 - 46 % 43.7 41.7  Platelets 150 - 400 K/uL 277 237    CMP Latest Ref Rng & Units 05/25/2020 05/18/2020  Glucose 70 - 99 mg/dL 138(H) 105(H)  BUN 8 - 23 mg/dL 8 11  Creatinine 0.44 - 1.00 mg/dL 0.72 0.72  Sodium 135 - 145 mmol/L 141 140  Potassium 3.5 - 5.1 mmol/L 3.8 3.4(L)  Chloride 98 - 111 mmol/L 106 105  CO2 22 - 32 mmol/L 23 24  Calcium 8.9 - 10.3 mg/dL 9.2 8.7(L)  Total Protein 6.5 - 8.1 g/dL 7.1 6.6   Total Bilirubin 0.3 - 1.2 mg/dL 0.8 0.8  Alkaline Phos 38 - 126 U/L 67 64  AST 15 - 41 U/L 17 15  ALT 0 - 44 U/L 14 14    Hepatic Function Latest Ref Rng & Units 05/25/2020 05/18/2020  Total Protein 6.5 - 8.1 g/dL 7.1 6.6  Albumin 3.5 - 5.0 g/dL 4.3 4.0  AST 15 - 41 U/L 17 15  ALT 0 - 44 U/L 14 14  Alk Phosphatase 38 - 126 U/L 67 64  Total Bilirubin 0.3 - 1.2 mg/dL 0.8 0.8  Bilirubin, Direct 0.0 - 0.2 mg/dL 0.1 -     Assessment:  #1.  Weight loss.  She has lost a total of 20 pounds in the last 4 months.  Trend has not changed but she is feeling better and it appears she is eating more.  Hopefully she will stop losing weight.  She does not have any symptoms to suggest hypothyroidism or adrenocortical insufficiency.  If weight loss continues she will have to be screened for both.  #2.  GERD and dyspepsia.  She is doing well with double dose PPI.  Plan:  Decrease pantoprazole to 40 mg p.o. every morning. Patient encouraged to walk daily if she can. Patient encouraged to eat at least 3 meals daily. Weight check in 1 month. Office visit in 3 months.

## 2020-10-22 ENCOUNTER — Other Ambulatory Visit (HOSPITAL_COMMUNITY): Payer: Self-pay | Admitting: Internal Medicine

## 2020-10-22 ENCOUNTER — Telehealth (INDEPENDENT_AMBULATORY_CARE_PROVIDER_SITE_OTHER): Payer: Self-pay | Admitting: *Deleted

## 2020-10-22 DIAGNOSIS — Z13820 Encounter for screening for osteoporosis: Secondary | ICD-10-CM

## 2020-10-22 DIAGNOSIS — Z1231 Encounter for screening mammogram for malignant neoplasm of breast: Secondary | ICD-10-CM

## 2020-10-22 NOTE — Telephone Encounter (Signed)
She presented to the office today for weight check and she weighed 147.4 lbs.  Patient was seen in the office 09/24/20- she weighed 151.0 lbs  Dr.Rehman made aware.

## 2020-11-02 ENCOUNTER — Emergency Department (HOSPITAL_COMMUNITY): Payer: Medicare Other

## 2020-11-02 ENCOUNTER — Emergency Department (HOSPITAL_COMMUNITY)
Admission: EM | Admit: 2020-11-02 | Discharge: 2020-11-02 | Disposition: A | Discharge status: Home / Self Care | Payer: Medicare Other | Attending: Emergency Medicine | Admitting: Emergency Medicine

## 2020-11-02 ENCOUNTER — Other Ambulatory Visit: Payer: Self-pay

## 2020-11-02 ENCOUNTER — Encounter (HOSPITAL_COMMUNITY): Payer: Self-pay | Admitting: Emergency Medicine

## 2020-11-02 DIAGNOSIS — I10 Essential (primary) hypertension: Secondary | ICD-10-CM | POA: Diagnosis not present

## 2020-11-02 DIAGNOSIS — F419 Anxiety disorder, unspecified: Secondary | ICD-10-CM | POA: Insufficient documentation

## 2020-11-02 DIAGNOSIS — Z79899 Other long term (current) drug therapy: Secondary | ICD-10-CM | POA: Insufficient documentation

## 2020-11-02 DIAGNOSIS — R251 Tremor, unspecified: Secondary | ICD-10-CM | POA: Diagnosis not present

## 2020-11-02 LAB — CBC WITH DIFFERENTIAL/PLATELET
Abs Immature Granulocytes: 0.01 10*3/uL (ref 0.00–0.07)
Basophils Absolute: 0 10*3/uL (ref 0.0–0.1)
Basophils Relative: 0 %
Eosinophils Absolute: 0 10*3/uL (ref 0.0–0.5)
Eosinophils Relative: 0 %
HCT: 44.7 % (ref 36.0–46.0)
Hemoglobin: 15.5 g/dL — ABNORMAL HIGH (ref 12.0–15.0)
Immature Granulocytes: 0 %
Lymphocytes Relative: 14 %
Lymphs Abs: 1 10*3/uL (ref 0.7–4.0)
MCH: 32.8 pg (ref 26.0–34.0)
MCHC: 34.7 g/dL (ref 30.0–36.0)
MCV: 94.5 fL (ref 80.0–100.0)
Monocytes Absolute: 0.5 10*3/uL (ref 0.1–1.0)
Monocytes Relative: 7 %
Neutro Abs: 6.1 10*3/uL (ref 1.7–7.7)
Neutrophils Relative %: 79 %
Platelets: 213 10*3/uL (ref 150–400)
RBC: 4.73 MIL/uL (ref 3.87–5.11)
RDW: 12.5 % (ref 11.5–15.5)
WBC: 7.7 10*3/uL (ref 4.0–10.5)
nRBC: 0 % (ref 0.0–0.2)

## 2020-11-02 LAB — COMPREHENSIVE METABOLIC PANEL
ALT: 10 U/L (ref 0–44)
AST: 13 U/L — ABNORMAL LOW (ref 15–41)
Albumin: 4.1 g/dL (ref 3.5–5.0)
Alkaline Phosphatase: 58 U/L (ref 38–126)
Anion gap: 9 (ref 5–15)
BUN: 11 mg/dL (ref 8–23)
CO2: 27 mmol/L (ref 22–32)
Calcium: 9.2 mg/dL (ref 8.9–10.3)
Chloride: 105 mmol/L (ref 98–111)
Creatinine, Ser: 0.73 mg/dL (ref 0.44–1.00)
GFR, Estimated: >60 mL/min (ref >60)
Glucose, Bld: 108 mg/dL — ABNORMAL HIGH (ref 70–99)
Potassium: 3.5 mmol/L (ref 3.5–5.1)
Sodium: 141 mmol/L (ref 135–145)
Total Bilirubin: 0.8 mg/dL (ref 0.3–1.2)
Total Protein: 6.9 g/dL (ref 6.5–8.1)

## 2020-11-02 LAB — TROPONIN I (HIGH SENSITIVITY)
Troponin I (High Sensitivity): 3 ng/L (ref <18)
Troponin I (High Sensitivity): 4 ng/L (ref <18)

## 2020-11-02 LAB — TSH: TSH: 0.982 u[IU]/mL (ref 0.350–4.500)

## 2020-11-02 LAB — MAGNESIUM: Magnesium: 2 mg/dL (ref 1.7–2.4)

## 2020-11-02 LAB — VITAMIN B12: Vitamin B-12: 169 pg/mL — ABNORMAL LOW (ref 180–914)

## 2020-11-02 LAB — LIPASE, BLOOD: Lipase: 25 U/L (ref 11–51)

## 2020-11-02 MED ORDER — HYDROXYZINE HCL 25 MG PO TABS: 25.0000 mg | ORAL_TABLET | Freq: Four times a day (QID) | ORAL | 0 refills | Status: DC

## 2020-11-02 NOTE — Discharge Instructions (Signed)
Follow-up with the neurologist as well as her primary care provider.  Return for new worsening symptoms.

## 2020-11-02 NOTE — ED Provider Notes (Signed)
[facility] EMERGENCY DEPARTMENT Provider Note   CSN: [id number] Arrival date & time: 11/02/20  1761     History Chief Complaint  Patient presents with  . Tremors    Haley Morrow is a 64 y.o. female with past with history significant for hypertension, hypercholesterolemia, weight loss, anxiety, depression who presents for evaluation of concerns for shaking.  Patient states she awoke this morning around 4:30 AM.  Reports that her bilateral upper and lower extremities felt tingly and shaky.  States she went back to bed and woke up at 6:00 this morning.  Patient states she had tingling which started to her left upper extremity and after a few minutes began to her right upper extremity.  She denies any sudden onset headache, neck pain, vision changes, facial droop, difficulty with word finding.  She called her daughter who is a Marine scientist.  She did not note any slurred speech.  No changes in vision.  Patient states that she has lost over 35 pounds over the last few months.  Feels like she stays cold all the time.  Feels like she has had intermittent episodes of bilateral trembling, pins-and-needles over the last year.  Was seen in the emergency department for this previously.  States it is not 9 however just feels "odd."  She is followed by Dr. Melony Overly with GI.  States she had an upper endoscopy and colonoscopy which showed some esophagitis and some polyps however no additional findings.  States she has continued to lose weight.  She does occasionally miss meals.  She has been seen multiple times by her PCP and started on 2 different antidepressants.  Patient states her first dose of Zoloft she took 2 doses and had abdominal pain so she stopped taking.  Was subsequently prescribed additional medication which she took 6 doses of and subsequently stopped for this 4 days ago because it was not working and it was keeping her up at night.  Patient states everyone keeps telling her that "it is my anxiety in my  depression."  Patient states she does not feel like this is anxiety and depression and feels like "there is something more wrong."  She denies any SI, HI, AVH.  States she has been tearful since her husband passed away because he was "my world."  States she does not cook as frequently as she has no one to [employer] for.  She denies any headache, lightheadedness, dizziness, neck pain, neck stiffness, chest pain, shortness of breath, weakness, lateral leg swelling, redness or warmth.  Patient states this morning she did have pain at her epigastric area.  Did not radiate.  States she has had this previously.  No prior history of CVA, TIA.  Has not taken anything for symptoms.  Patient states her symptoms lasted a few seconds and then resolved.  History obtained from patient and past medical records.  No interpreter used.  HPI     Past Medical History:  Diagnosis Date  . High cholesterol   . Hypertension     Patient Active Problem List   Diagnosis Date Noted  . GERD (gastroesophageal reflux disease) 09/24/2020  . Weight loss 09/24/2020    Past Surgical History:  Procedure Laterality Date  . ABDOMINAL HYSTERECTOMY    . BIOPSY  06/17/2020   Procedure: BIOPSY;  Surgeon: Rogene Houston, MD;  Location: [facility];  Service: Endoscopy;;  . COLONOSCOPY N/A 06/17/2020   Procedure: COLONOSCOPY;  Surgeon: Rogene Houston, MD;  Location: [facility];  Service: Endoscopy;  Laterality: N/A;  955  . ESOPHAGOGASTRODUODENOSCOPY N/A 06/17/2020   Procedure: ESOPHAGOGASTRODUODENOSCOPY (EGD);  Surgeon: Rogene Houston, MD;  Location: [facility];  Service: Endoscopy;  Laterality: N/A;  . POLYPECTOMY  06/17/2020   Procedure: POLYPECTOMY;  Surgeon: Rogene Houston, MD;  Location: [facility];  Service: Endoscopy;;  . SHOULDER EXAMINAITON UNDER ANESTHESIA W/ INJECTION Left      OB History    Gravida      Para      Term      Preterm      AB      Living  3     SAB      TAB      Ectopic       Multiple      Live Births              Family History  Problem Relation Age of Onset  . CAD Mother   . Cancer Mother   . Diabetes Father   . Stroke Father     Social History   Tobacco Use  . Smoking status: Never Smoker  . Smokeless tobacco: Never Used  Vaping Use  . Vaping Use: Never used  Substance Use Topics  . Alcohol use: Never  . Drug use: Never    Home Medications Prior to Admission medications   Medication Sig Start Date End Date Taking? Authorizing Provider  hydrOXYzine (ATARAX/VISTARIL) 25 MG tablet Take 1 tablet (25 mg total) by mouth every 6 (six) hours. 11/02/20   Elgene Coral A, PA-C  loratadine (CLARITIN) 10 MG tablet Take 10 mg by mouth daily.    [provider]  losartan (COZAAR) 100 MG tablet Take 100 mg by mouth daily. 04/26/20   [provider]  pantoprazole (PROTONIX) 40 MG tablet Take 1 tablet (40 mg total) by mouth daily before breakfast. 09/24/20   Rogene Houston, MD  rosuvastatin (CRESTOR) 10 MG tablet Take 10 mg by mouth every Monday, Wednesday, and Friday.  04/26/20   [provider]    Allergies    Patient has no known allergies.  Review of Systems   Review of Systems  Constitutional: Positive for activity change, appetite change, chills and fatigue.  HENT: Negative.   Respiratory: Negative.   Gastrointestinal: Positive for abdominal pain (Epigastric pain). Negative for abdominal distention, anal bleeding, blood in stool, constipation, diarrhea, nausea, rectal pain and vomiting.  Genitourinary: Negative.   Musculoskeletal: Negative.   Skin: Negative.   Neurological: Positive for numbness (BL tingling). Negative for dizziness, tremors, seizures, syncope, facial asymmetry, speech difficulty, weakness, light-headedness and headaches.  All other systems reviewed and are negative.   Physical Exam Updated Vital Signs BP 138/69   Pulse 60   Temp 97.8 F (36.6 C) (Oral)   Resp 12   Ht 5\' 3"  (1.6 m)   Wt  66.7 kg   SpO2 100%   BMI 26.04 kg/m   Physical Exam Physical Exam  Constitutional: Pt is oriented to person, place, and time. Pt appears well-developed and well-nourished. No distress.  HENT:  Head: Normocephalic and atraumatic.  Mouth/Throat: Oropharynx is clear and moist.  Eyes: Conjunctivae and EOM are normal. Pupils are equal, round, and reactive to light. No scleral icterus.  No horizontal, vertical or rotational nystagmus  Neck: Normal range of motion. Neck supple.  Full active and passive ROM without pain No midline or paraspinal tenderness No nuchal rigidity or meningeal signs  Cardiovascular: Normal rate, regular rhythm  and intact distal pulses.   Pulmonary/Chest: Effort normal and breath sounds normal. No respiratory distress. Pt has no wheezes. No rales.  Abdominal: Soft. Bowel sounds are normal. There is no tenderness. There is no rebound and no guarding.  Musculoskeletal: Normal range of motion.  Lymphadenopathy:    No cervical adenopathy.  Neurological: Pt. is alert and oriented to person, place, and time. He has normal reflexes. No cranial nerve deficit.  Exhibits normal muscle tone. Coordination normal.  Mental Status:  Alert, oriented, thought content appropriate. Speech fluent without evidence of aphasia. Able to follow 2 step commands without difficulty.  Cranial Nerves:  II:  Peripheral visual fields grossly normal, pupils equal, round, reactive to light III,IV, VI: ptosis not present, extra-ocular motions intact bilaterally  V,VII: smile symmetric, facial light touch sensation equal VIII: hearing grossly normal bilaterally  IX,X: midline uvula rise  XI: bilateral shoulder shrug equal and strong XII: midline tongue extension  Motor:  5/5 in upper and lower extremities bilaterally including strong and equal grip strength and dorsiflexion/plantar flexion Sensory: Pinprick and light touch normal in all extremities.  Deep Tendon Reflexes: 2+ and symmetric    Cerebellar: normal finger-to-nose with bilateral upper extremities Gait: normal gait and balance CV: distal pulses palpable throughout   Skin: Skin is warm and dry. No rash noted. Pt is not diaphoretic.  Psychiatric: Pt has a normal mood and affect. Behavior is normal. Judgment and thought content normal.  Nursing note and vitals reviewed. ED Results / Procedures / Treatments   Labs (all labs ordered are listed, but only abnormal results are displayed) Labs Reviewed  CBC WITH DIFFERENTIAL/PLATELET - Abnormal; Notable for the following components:      Result Value   Hemoglobin 15.5 (*)    All other components within normal limits  COMPREHENSIVE METABOLIC PANEL - Abnormal; Notable for the following components:   Glucose, Bld 108 (*)    AST 13 (*)    All other components within normal limits  VITAMIN B12 - Abnormal; Notable for the following components:   Vitamin B-12 169 (*)    All other components within normal limits  LIPASE, BLOOD  TSH  MAGNESIUM  TROPONIN I (HIGH SENSITIVITY)  TROPONIN I (HIGH SENSITIVITY)    EKG EKG Interpretation  Date/Time:  Saturday November 02 2020 10:56:44 EST Ventricular Rate:  62 PR Interval:    QRS Duration: 100 QT Interval:  400 QTC Calculation: 407 R Axis:   -2 Text Interpretation: Sinus rhythm Low voltage, precordial leads Abnormal R-wave progression, early transition No STEMI Confirmed by Octaviano Glow [phone]) on 11/02/2020 12:56:12 PM   Radiology DG Chest 2 View  Result Date: 11/02/2020 CLINICAL DATA:  Shortness of breath beginning this morning. EXAM: CHEST - 2 VIEW COMPARISON:  05/25/2020 FINDINGS: The heart size and mediastinal contours are within normal limits. Mild pulmonary hyperinflation noted. Both lungs are clear. The visualized skeletal structures are unremarkable. IMPRESSION: No active cardiopulmonary disease. Electronically Signed   By: Marlaine Hind M.D.   On: 11/02/2020 11:43   CT Head Wo Contrast  Result Date:  11/02/2020 CLINICAL DATA:  Left upper extremity tingling sensation and generalized shaking/trembling sensation EXAM: CT HEAD WITHOUT CONTRAST TECHNIQUE: Contiguous axial images were obtained from the base of the skull through the vertex without intravenous contrast. COMPARISON:  None. FINDINGS: Brain: Ventricles and sulci are normal in size and configuration for age. There is no intracranial mass, hemorrhage, extra-axial fluid collection, or midline shift. The brain parenchyma appears unremarkable. No demonstrable acute infarct.  Vascular: No hyperdense vessel. There is slight calcification in the left carotid siphon. Skull: The bony calvarium appears intact. Sinuses/Orbits: Mucosal thickening in several ethmoid air cells noted. Other visualized paranasal sinuses are clear. Visualized orbits appear symmetric bilaterally. Other: Mastoid air cells are clear. IMPRESSION: No evident acute infarct. No mass or hemorrhage. Slight arterial vascular calcification noted. Mucosal thickening noted in several ethmoid air cells. Electronically Signed   By: Lowella Grip III M.D.   On: 11/02/2020 11:44    Procedures Procedures (including critical care time)  Medications Ordered in ED Medications - No data to display  ED Course  I have reviewed the triage vital signs and the nursing notes.  Pertinent labs & imaging results that were available during my care of the patient were reviewed by me and considered in my medical decision making (see chart for details).   Patient presents for evaluation of shakiness and intermittent tingling. Occurring intermittently over the last year. She has been seen for this here as well as at her PCP. She feels like her extremities get "cold." Unfortunately had a passing of her significant other less than 1 year ago when symptoms seem to be gone. Has tried 2 different outpatient antidepressant and anxiety medicines however states it did not make her feel very and she stopped taking  these within a week of starting them. Patient with resolution of symptoms prior to arrival. She is very tearful in room. Unfortunately has had decreased appetite and persistent weight loss. She is concerned this may be her thyroid. On arrival patient heart lungs clear. Abdomen soft, nontender. She has a nonfocal neuro exam without deficits. Plan on labs and imaging and reassess  Labs and imaging personally reviewed and interpreted:  CBC without leukocytosis, hemoglobin stable Metabolic panel with mild hyperglycemia to 1 away however no additional electrolyte, renal abnormality Magnesium 2.0 Delta troponin flat TSH 0.92 B12 169 CT head without any acute infarct. Have low suspicion for LVO Chest x-ray without infiltrates, cardiomegaly [place] edema, pneumothorax EKG without ischemia  Patient reassessed. She continues to be without symptoms. Discussed reassuring work-up in the emergency department. Patient symptoms likely multifactorial in nature. Question anxiety. Will place referral to neurology for intermittent paresthesias as patient states this has been occurring for increased length of time and does not feel this is related to her anxiety for second opinion. She has no evidence of seizure-like activity. Symptoms do not seem consistent with CVA, ACS, dissection. We will have her follow-up outpatient with PCP for reevaluation. I did write for small dose of Vistaril for her anxiety as she states the Xanax prescribed by her PCP she was afraid to take that this would be "too strong." Discussed resources for outpatient follow-up for her depression and anxiety. She is agreeable to this. She denies any SI, HI, AVH.  The patient has been appropriately medically screened and/or stabilized in the ED. I have low suspicion for any other emergent medical condition which would require further screening, evaluation or treatment in the ED or require inpatient management.  Patient is hemodynamically stable and in  no acute distress.  Patient able to ambulate in department prior to ED.  Evaluation does not show acute pathology that would require ongoing or additional emergent interventions while in the emergency department or further inpatient treatment.  I have discussed the diagnosis with the patient and answered all questions.  Pain is been managed while in the emergency department and patient has no further complaints prior to discharge.  Patient is  comfortable with plan discussed in room and is stable for discharge at this time.  I have discussed strict return precautions for returning to the emergency department.  Patient was encouraged to follow-up with PCP/specialist refer to at discharge.      MDM Rules/Calculators/A&P                           Final Clinical Impression(s) / ED Diagnoses Final diagnoses:  Tremors of nervous system  Anxiety    Rx / DC Orders ED Discharge Orders         Ordered    hydrOXYzine (ATARAX/VISTARIL) 25 MG tablet  Every 6 hours        11/02/20 1514           Arron Tetrault A, PA-C 11/02/20 1549    Wyvonnia Dusky, MD 11/02/20 1750

## 2020-11-02 NOTE — ED Triage Notes (Signed)
Pt reports generalized trembling/shakey feeling since 430 this am. Pt reports left arm tingling and reports when EMS came out to residence and reports this resolved. Pt reports continued generalized uneasiness denies vision changes, numbness/tinging in extremities. Steady gait. Pt ambulates with steady gait Pt reports has lost 35 lbs over last several months and stays cold all the time. Pt reports "I wonder if its my thyroid." Pt reports history of similar symptoms.

## 2020-11-21 NOTE — Telephone Encounter (Signed)
Talked with patient today She says she is eating well but has not checked her weight since her recheck in the office 4 weeks ago. Patient is coming to [place] week after next for some test. I asked if she could stop by the office so we could check her weight again.

## 2020-11-22 NOTE — Telephone Encounter (Signed)
Noted  

## 2020-11-29 ENCOUNTER — Ambulatory Visit (HOSPITAL_COMMUNITY)
Admission: RE | Admit: 2020-11-29 | Discharge: 2020-11-29 | Disposition: A | Discharge status: Home / Self Care | Payer: Medicare Other | Source: Ambulatory Visit | Attending: Internal Medicine | Admitting: Internal Medicine

## 2020-11-29 ENCOUNTER — Other Ambulatory Visit: Payer: Self-pay

## 2020-11-29 DIAGNOSIS — Z13820 Encounter for screening for osteoporosis: Secondary | ICD-10-CM | POA: Insufficient documentation

## 2020-11-29 DIAGNOSIS — Z1231 Encounter for screening mammogram for malignant neoplasm of breast: Secondary | ICD-10-CM | POA: Diagnosis not present

## 2020-11-29 DIAGNOSIS — M8589 Other specified disorders of bone density and structure, multiple sites: Secondary | ICD-10-CM | POA: Diagnosis not present

## 2020-11-29 DIAGNOSIS — Z780 Asymptomatic menopausal state: Secondary | ICD-10-CM | POA: Insufficient documentation

## 2020-12-10 ENCOUNTER — Ambulatory Visit: Payer: Medicare Other | Admitting: Neurology

## 2020-12-24 ENCOUNTER — Inpatient Hospital Stay
Admission: RE | Admit: 2020-12-24 | Discharge: 2020-12-24 | Disposition: A | Discharge status: Home / Self Care | Payer: Self-pay | Source: Ambulatory Visit | Attending: Internal Medicine | Admitting: Internal Medicine

## 2020-12-24 ENCOUNTER — Other Ambulatory Visit (HOSPITAL_COMMUNITY): Payer: Self-pay | Admitting: Internal Medicine

## 2020-12-24 ENCOUNTER — Ambulatory Visit
Admission: RE | Admit: 2020-12-24 | Discharge: 2020-12-24 | Disposition: A | Discharge status: Home / Self Care | Payer: Self-pay | Source: Ambulatory Visit | Attending: Internal Medicine | Admitting: Internal Medicine

## 2020-12-24 DIAGNOSIS — Z1231 Encounter for screening mammogram for malignant neoplasm of breast: Secondary | ICD-10-CM

## 2020-12-31 ENCOUNTER — Ambulatory Visit (INDEPENDENT_AMBULATORY_CARE_PROVIDER_SITE_OTHER): Payer: Medicare Other | Admitting: Internal Medicine

## 2021-02-25 ENCOUNTER — Ambulatory Visit: Payer: Medicare Other | Admitting: Neurology

## 2021-02-25 ENCOUNTER — Other Ambulatory Visit: Payer: Self-pay

## 2021-02-25 ENCOUNTER — Encounter: Payer: Self-pay | Admitting: Neurology

## 2021-02-25 VITALS — BP 140/80 | HR 77 | Ht 63.0 in | Wt 144.5 lb

## 2021-02-25 DIAGNOSIS — R200 Anesthesia of skin: Secondary | ICD-10-CM | POA: Diagnosis not present

## 2021-02-25 DIAGNOSIS — F322 Major depressive disorder, single episode, severe without psychotic features: Secondary | ICD-10-CM | POA: Diagnosis not present

## 2021-02-25 DIAGNOSIS — F419 Anxiety disorder, unspecified: Secondary | ICD-10-CM

## 2021-02-25 MED ORDER — ARIPIPRAZOLE 5 MG PO TABS: 5.0000 mg | ORAL_TABLET | Freq: Every day | ORAL | 5 refills | Status: DC

## 2021-02-25 NOTE — Progress Notes (Signed)
[facility]  PATIENT: Haley Morrow DOB: 1956/09/06  REFERRING DOCTOR OR PCP: Allyn Kenner, MD SOURCE: Patient, notes from emergency room 11/02/2020, lab and imaging reports, CT images personally reviewed.  _________________________________   HISTORICAL  CHIEF COMPLAINT:  Chief Complaint  Patient presents with  . New Patient (Initial Visit)    RM 22 with daughter. ED referral for parasthesia. Pt denies numbness/tingling. Lost husband 02/2020. Had ongoing stomach pain. Found polyp. She woke up 10/2020 trembling/shaking. She is unsure what has caused these episodes. She feels anxiety might be playing a role but concerned and would like to be evaluated thoroughly.       HISTORY OF PRESENT ILLNESS:  I had the pleasure of seeing your patient, Haley Morrow, at [facility] for neurologic consultation regarding her paresthesias.  She is a 65 year old woman who woke up in the early am 11/02/2020 and felt her body was quivering.   The tremor more inside her body rather than external.   She felt nervous while it was happening.    When she woke up in the morning, she felt even more nervous/antsy.  She noted numbness in her left hand.   She was hyperventilating and noticed the numbness improved when she stopped hyperventilating.    She called 911 who checked her out and her daughter took her to the ED.   Cardiac enzymes and CT were fine.      She was prescribed hydroxyzine to take 25 mg po prn.      She denies other episodes of numbness or trembling.    She has had other spells of lightheadedness and presyncope, usually after standing up or while standing.  Seven years ago, she had spells of lightheadedness while eating.     She has had anxiety and depression but felt the anxiety was worse during the 11/02/2020 episode.    .She took sertraline but had stomach upset after a few pills and needed to go to the ED.     She then was placed on Lexapro but mood  worsened and she stopped after 6 days.     Her husband died suddenly (MI) last year.  They have been together since age 58.    She has been more depressed since her husband died last year.   She is often tearful at ngiht, especially thinking of him.  Data reviewed today CT of the head 11/02/2020 was personally reviewed.  The brain appears normal.  Mild chronic ethmoid sinusitis noted.  Laboratory tests reviewed 11/02/2020.  Vitamin B12 was mildly low at 169 (180 is lower limit of normal) TSH, magnesium, cardiac enzymes were normal.  CMP and CBC with differential were unremarkable.  REVIEW OF SYSTEMS: Constitutional: No fevers, chills, sweats, or change in appetite Eyes: No visual changes, double vision, eye pain Ear, nose and throat: No hearing loss, ear pain, nasal congestion, sore throat Cardiovascular: No chest pain, palpitations Respiratory: No shortness of breath at rest or with exertion.   No wheezes GastrointestinaI: No nausea, vomiting, diarrhea, abdominal pain, fecal incontinence Genitourinary: No dysuria, urinary retention or frequency.  No nocturia. Musculoskeletal: No neck pain, back pain Integumentary: No rash, pruritus, skin lesions Neurological: as above Psychiatric: No depression at this time.  No anxiety Endocrine: No palpitations, diaphoresis, change in appetite, change in weigh or increased thirst Hematologic/Lymphatic: No anemia, purpura, petechiae. Allergic/Immunologic: No itchy/runny eyes, nasal congestion, recent allergic reactions, rashes  ALLERGIES: No Known Allergies  HOME MEDICATIONS:  Current Outpatient Medications:  .  ARIPiprazole (ABILIFY) 5 MG tablet, Take 1 tablet (5 mg total) by mouth daily., Disp: 30 tablet, Rfl: 5 .  hydrOXYzine (ATARAX/VISTARIL) 25 MG tablet, Take 1 tablet (25 mg total) by mouth every 6 (six) hours., Disp: 12 tablet, Rfl: 0 .  loratadine (CLARITIN) 10 MG tablet, Take 10 mg by mouth daily., Disp: , Rfl:  .  losartan (COZAAR) 50  MG tablet, Take 50 mg by mouth daily., Disp: , Rfl:  .  pantoprazole (PROTONIX) 40 MG tablet, Take 1 tablet (40 mg total) by mouth daily before breakfast., Disp: 30 tablet, Rfl: 5 .  Prenatal Vit-DSS-Fe Cbn-FA (PRENATAL AD PO), Take by mouth., Disp: , Rfl:  .  rosuvastatin (CRESTOR) 10 MG tablet, Take 10 mg by mouth every Monday, Wednesday, and Friday. , Disp: , Rfl:   PAST MEDICAL HISTORY: Past Medical History:  Diagnosis Date  . High cholesterol   . Hypertension     PAST SURGICAL HISTORY: Past Surgical History:  Procedure Laterality Date  . ABDOMINAL HYSTERECTOMY    . BIOPSY  06/17/2020   Procedure: BIOPSY;  Surgeon: Rogene Houston, MD;  [facility];  Service: Endoscopy;;  . COLONOSCOPY N/A 06/17/2020   Procedure: COLONOSCOPY;  Surgeon: Rogene Houston, MD;  [facility];  Service: Endoscopy;  Laterality: N/A;  955  . ESOPHAGOGASTRODUODENOSCOPY N/A 06/17/2020   Procedure: ESOPHAGOGASTRODUODENOSCOPY (EGD);  Surgeon: Rogene Houston, MD;  [facility];  Service: Endoscopy;  Laterality: N/A;  . POLYPECTOMY  06/17/2020   Procedure: POLYPECTOMY;  Surgeon: Rogene Houston, MD;  [facility];  Service: Endoscopy;;  . SHOULDER EXAMINAITON UNDER ANESTHESIA W/ INJECTION Left     FAMILY HISTORY: Family History  Problem Relation Age of Onset  . CAD Mother   . Cancer Mother   . Diabetes Father   . Stroke Father     SOCIAL HISTORY:  Social History   Socioeconomic History  . Marital status: Widowed    Spouse name: Not on file  . Number of children: 3  . Years of education: 9  . Highest education level: Not on file  Occupational History  . Not on file  Tobacco Use  . Smoking status: Never Smoker  . Smokeless tobacco: Never Used  Vaping Use  . Vaping Use: Never used  Substance and Sexual Activity  . Alcohol use: Never  . Drug use: Never  . Sexual activity: Not Currently  Other Topics Concern  . Not on file  Social History  Narrative   Lives alone   Right handed   Caffeine use: Tea/coffee daily ( 2 each morning), no soda   Social Determinants of Health   Financial Resource Strain: Not on file  Food Insecurity: Not on file  Transportation Needs: Not on file  Physical Activity: Not on file  Stress: Not on file  Social Connections: Not on file  Intimate Partner Violence: Not on file     PHYSICAL EXAM  Vitals:   02/25/21 1038  BP: 140/80  Pulse: 77  Weight: 144 lb 8 oz (65.5 kg)  Height: 5\' 3"  (1.6 m)    Body mass index is 25.6 kg/m.   General: The patient is well-developed and well-nourished and in no acute distress  HEENT:  Head is [place]/AT.  Sclera are anicteric.     Neck: No carotid bruits are noted.  The neck is nontender.  Cardiovascular: The heart has a regular rate and rhythm with a normal S1 and S2. There were  no murmurs, gallops or rubs.    Skin: Extremities are without rash or  edema.  Musculoskeletal:  Back is nontender  Neurologic Exam  Mental status: The patient is alert and oriented x 3 at the time of the examination. The patient has apparent normal recent and remote memory, with an apparently normal attention span and concentration ability.   Speech is normal.  Cranial nerves: Extraocular movements are full. Pupils are equal, round, and reactive to light and accomodation.     Facial symmetry is present. There is good facial sensation to soft touch bilaterally.Facial strength is normal.  Trapezius and sternocleidomastoid strength is normal. No dysarthria is noted.  The tongue is midline, and the patient has symmetric elevation of the soft palate. No obvious hearing deficits are noted.  Motor:  Muscle bulk is normal.   Tone is normal. Strength is  5 / 5 in all 4 extremities.   Sensory: Sensory testing is intact to pinprick, soft touch and vibration sensation in all 4 extremities.  Coordination: Cerebellar testing reveals good finger-nose-finger and heel-to-shin  bilaterally.  Gait and station: Station is normal.   Gait is normal. Tandem gait is slightly wide. Romberg is negative.   Reflexes: Deep tendon reflexes are symmetric and normal bilaterally.   Plantar responses are flexor.    DIAGNOSTIC DATA (LABS, IMAGING, TESTING) - I reviewed patient records, labs, notes, testing and imaging myself where available.  Lab Results  Component Value Date   WBC 7.7 11/02/2020   HGB 15.5 (H) 11/02/2020   HCT 44.7 11/02/2020   MCV 94.5 11/02/2020   PLT 213 11/02/2020      Component Value Date/Time   NA 141 11/02/2020 1048   K 3.5 11/02/2020 1048   CL 105 11/02/2020 1048   CO2 27 11/02/2020 1048   GLUCOSE 108 (H) 11/02/2020 1048   BUN 11 11/02/2020 1048   CREATININE 0.73 11/02/2020 1048   CALCIUM 9.2 11/02/2020 1048   PROT 6.9 11/02/2020 1048   ALBUMIN 4.1 11/02/2020 1048   AST 13 (L) 11/02/2020 1048   ALT 10 11/02/2020 1048   ALKPHOS 58 11/02/2020 1048   BILITOT 0.8 11/02/2020 1048   GFRNONAA >60 11/02/2020 1048   GFRAA >60 05/25/2020 1354   No results found for: CHOL, HDL, LDLCALC, LDLDIRECT, TRIG, CHOLHDL No results found for: HGBA1C Lab Results  Component Value Date   VITAMINB12 169 (L) 11/02/2020   Lab Results  Component Value Date   TSH 0.982 11/02/2020       ASSESSMENT AND PLAN  Numbness  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  She also has anxiety and depression.    Her CT scan and physical exam are normal.   I think a neurologic etiology of symptoms is unlikely.   Therefore, I'll hold off on additional imaging.       We discussed her anxiety is likely amplifying some of her symptoms.   She had been unable to tolerate sertraline or escitalopram.  She takes hydroxyzine only as needed.   I will have her try low dose Abilify.   Hopefully this will help.  If it does not help enough, buspirone might be of benefit.    Also,  consider referral to psychiatry.     I will see her back as needed for new or worsening neurologic issues.     Haley Morrow A. Felecia Shelling, MD, [facility] 02/25/2021, 12:25 PM  Certified in Neurology, [place] Neurophysiology, Sleep Medicine and Neuroimaging  [facility] [address] [place], [place] [address] [phone]

## 2021-02-27 ENCOUNTER — Telehealth: Payer: Self-pay | Admitting: *Deleted

## 2021-02-27 NOTE — Telephone Encounter (Signed)
Submitted PA on CMM. Key: [id number]. Waiting on determination from [employer].

## 2021-03-03 NOTE — Telephone Encounter (Signed)
Received fax from clinical review department for [employer] that they are not the designated pharmacy benefit manager. We should contact pt's [employer] directly to complete PA.

## 2021-03-03 NOTE — Telephone Encounter (Signed)
Initiated new PA on CMM. Key: [id number]. Waiting on determination from [employer].

## 2021-03-03 NOTE — Telephone Encounter (Signed)
PA approved effective from 03/03/2021 through 03/03/2022.

## 2021-04-09 ENCOUNTER — Other Ambulatory Visit (INDEPENDENT_AMBULATORY_CARE_PROVIDER_SITE_OTHER): Payer: Self-pay | Admitting: Internal Medicine

## 2021-04-28 DIAGNOSIS — E785 Hyperlipidemia, unspecified: Secondary | ICD-10-CM | POA: Diagnosis not present

## 2021-04-28 DIAGNOSIS — R7301 Impaired fasting glucose: Secondary | ICD-10-CM | POA: Diagnosis not present

## 2021-04-28 DIAGNOSIS — I10 Essential (primary) hypertension: Secondary | ICD-10-CM | POA: Diagnosis not present

## 2021-05-01 DIAGNOSIS — I129 Hypertensive chronic kidney disease with stage 1 through stage 4 chronic kidney disease, or unspecified chronic kidney disease: Secondary | ICD-10-CM | POA: Diagnosis not present

## 2021-05-01 DIAGNOSIS — R7301 Impaired fasting glucose: Secondary | ICD-10-CM | POA: Diagnosis not present

## 2021-05-01 DIAGNOSIS — F329 Major depressive disorder, single episode, unspecified: Secondary | ICD-10-CM | POA: Diagnosis not present

## 2021-05-01 DIAGNOSIS — E782 Mixed hyperlipidemia: Secondary | ICD-10-CM | POA: Diagnosis not present

## 2021-10-27 DIAGNOSIS — L821 Other seborrheic keratosis: Secondary | ICD-10-CM | POA: Diagnosis not present

## 2021-10-27 DIAGNOSIS — L590 Erythema ab igne [dermatitis ab igne]: Secondary | ICD-10-CM | POA: Diagnosis not present

## 2021-10-27 DIAGNOSIS — L568 Other specified acute skin changes due to ultraviolet radiation: Secondary | ICD-10-CM | POA: Diagnosis not present

## 2021-10-27 DIAGNOSIS — D225 Melanocytic nevi of trunk: Secondary | ICD-10-CM | POA: Diagnosis not present

## 2021-11-03 DIAGNOSIS — R7301 Impaired fasting glucose: Secondary | ICD-10-CM | POA: Diagnosis not present

## 2021-11-03 DIAGNOSIS — I129 Hypertensive chronic kidney disease with stage 1 through stage 4 chronic kidney disease, or unspecified chronic kidney disease: Secondary | ICD-10-CM | POA: Diagnosis not present

## 2021-11-03 DIAGNOSIS — E782 Mixed hyperlipidemia: Secondary | ICD-10-CM | POA: Diagnosis not present

## 2021-11-06 DIAGNOSIS — R7301 Impaired fasting glucose: Secondary | ICD-10-CM | POA: Diagnosis not present

## 2021-11-06 DIAGNOSIS — I129 Hypertensive chronic kidney disease with stage 1 through stage 4 chronic kidney disease, or unspecified chronic kidney disease: Secondary | ICD-10-CM | POA: Diagnosis not present

## 2021-11-06 DIAGNOSIS — E782 Mixed hyperlipidemia: Secondary | ICD-10-CM | POA: Diagnosis not present

## 2021-11-06 DIAGNOSIS — F329 Major depressive disorder, single episode, unspecified: Secondary | ICD-10-CM | POA: Diagnosis not present

## 2021-11-16 ENCOUNTER — Other Ambulatory Visit (INDEPENDENT_AMBULATORY_CARE_PROVIDER_SITE_OTHER): Payer: Self-pay | Admitting: Internal Medicine

## 2021-11-17 NOTE — Telephone Encounter (Signed)
Needs office visit. Last visit oct 2021

## 2021-12-04 ENCOUNTER — Other Ambulatory Visit (HOSPITAL_COMMUNITY): Payer: Self-pay | Admitting: Internal Medicine

## 2021-12-04 DIAGNOSIS — Z1231 Encounter for screening mammogram for malignant neoplasm of breast: Secondary | ICD-10-CM

## 2021-12-29 ENCOUNTER — Other Ambulatory Visit: Payer: Self-pay

## 2021-12-29 ENCOUNTER — Ambulatory Visit (HOSPITAL_COMMUNITY)
Admission: RE | Admit: 2021-12-29 | Discharge: 2021-12-29 | Disposition: A | Discharge status: Home / Self Care | Payer: Medicare Other | Source: Ambulatory Visit | Attending: Internal Medicine | Admitting: Internal Medicine

## 2021-12-29 DIAGNOSIS — Z1231 Encounter for screening mammogram for malignant neoplasm of breast: Secondary | ICD-10-CM | POA: Insufficient documentation

## 2021-12-30 ENCOUNTER — Other Ambulatory Visit (HOSPITAL_COMMUNITY): Payer: Self-pay | Admitting: Internal Medicine

## 2021-12-30 DIAGNOSIS — R928 Other abnormal and inconclusive findings on diagnostic imaging of breast: Secondary | ICD-10-CM

## 2022-01-06 ENCOUNTER — Other Ambulatory Visit: Payer: Self-pay

## 2022-01-06 ENCOUNTER — Ambulatory Visit (HOSPITAL_COMMUNITY)
Admission: RE | Admit: 2022-01-06 | Discharge: 2022-01-06 | Disposition: A | Discharge status: Home / Self Care | Payer: Medicare Other | Source: Ambulatory Visit | Attending: Internal Medicine | Admitting: Internal Medicine

## 2022-01-06 DIAGNOSIS — R922 Inconclusive mammogram: Secondary | ICD-10-CM | POA: Diagnosis not present

## 2022-01-06 DIAGNOSIS — N6489 Other specified disorders of breast: Secondary | ICD-10-CM | POA: Diagnosis not present

## 2022-01-06 DIAGNOSIS — R928 Other abnormal and inconclusive findings on diagnostic imaging of breast: Secondary | ICD-10-CM | POA: Insufficient documentation

## 2022-01-24 IMAGING — MG MM DIGITAL DIAGNOSTIC UNILAT*R* W/ TOMO W/ CAD
6 series · 6 of 18 positions shown · non-contrast
Comparison: Previous exams.

CLINICAL DATA: Screening recall for right breast asymmetry.

EXAM:
DIGITAL DIAGNOSTIC UNILATERAL RIGHT MAMMOGRAM WITH TOMOSYNTHESIS AND
CAD
TECHNIQUE: Right digital diagnostic mammography and breast tomosynthesis was
performed. The images were evaluated with computer-aided detection.

[R MLO synth-2D (1 of 2)]
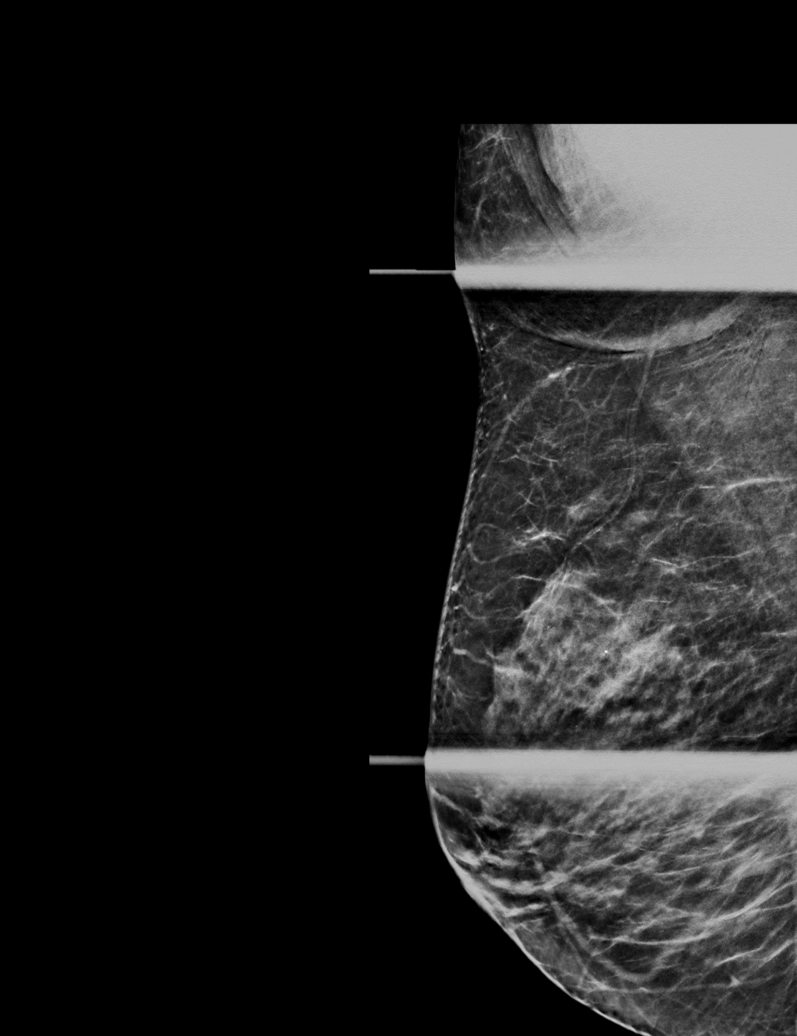

[R MLO synth-2D (2 of 2)]
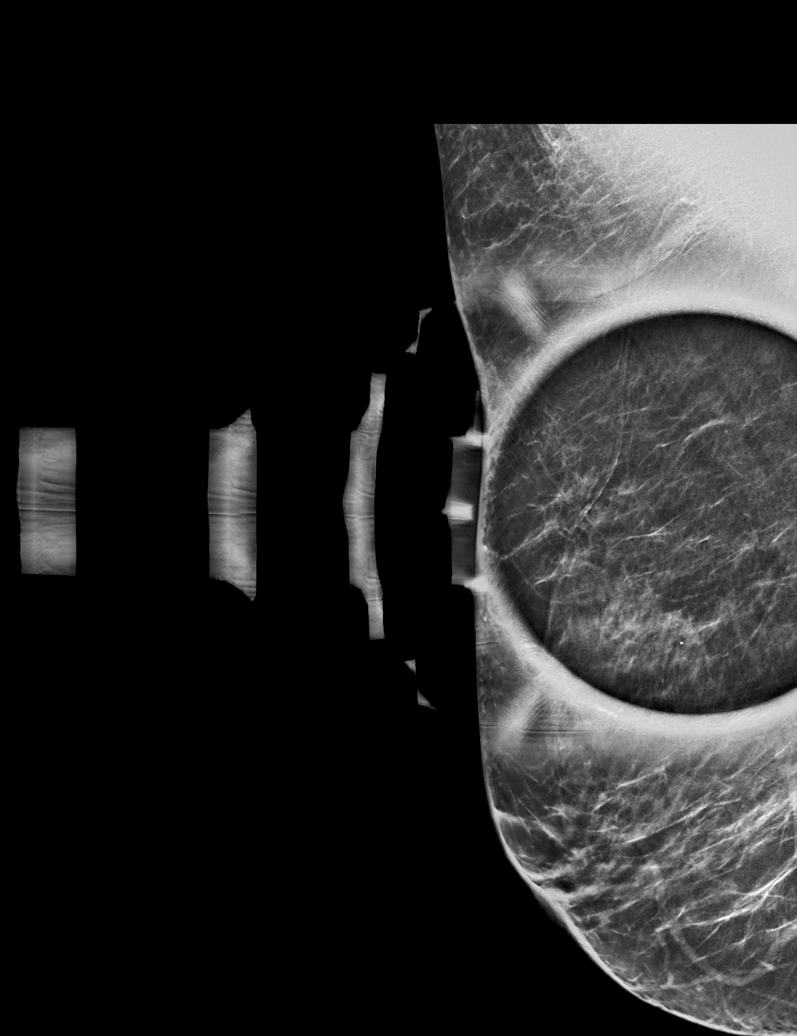

[R ML synth-2D]
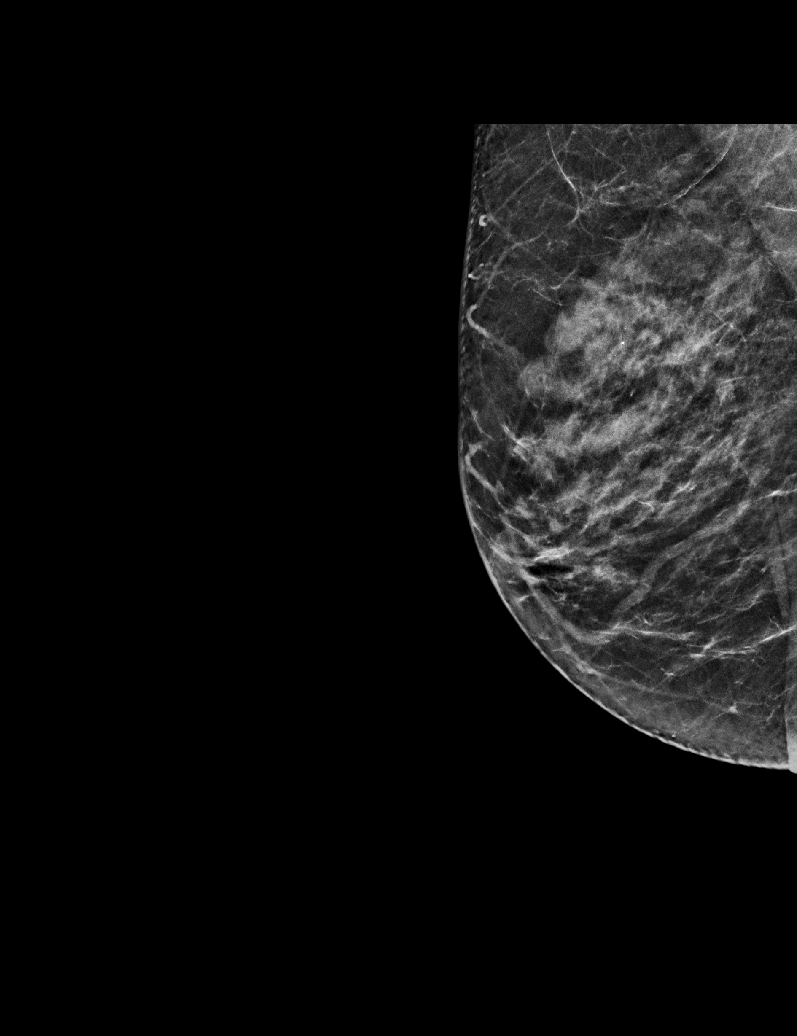

[R ML tomo · tomo slice 26/51.0]
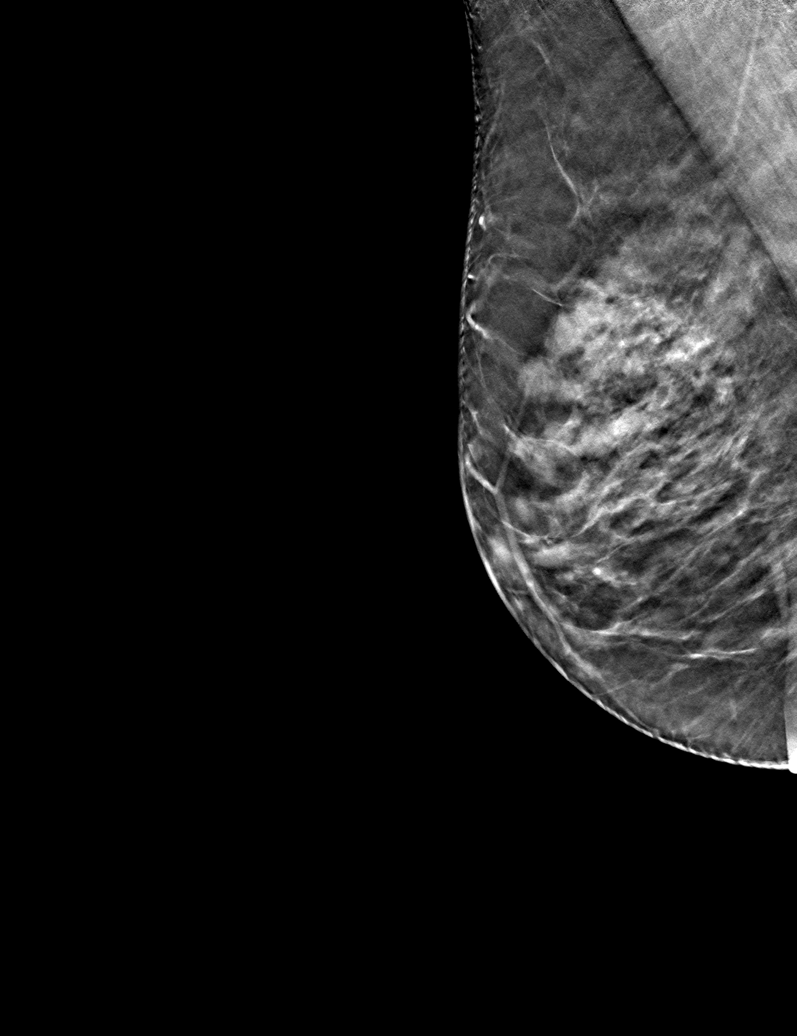

[R MLO tomo (1 of 2) · tomo slice 26/51.0]
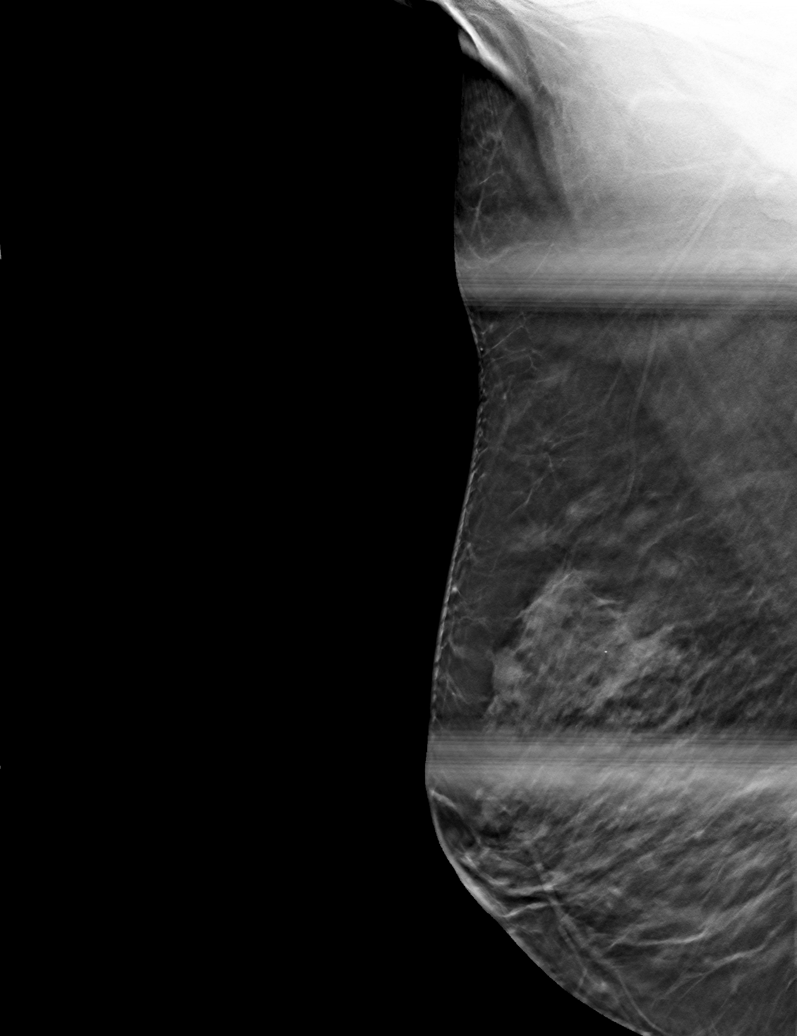

[R MLO tomo (2 of 2) · tomo slice 25/50.0]
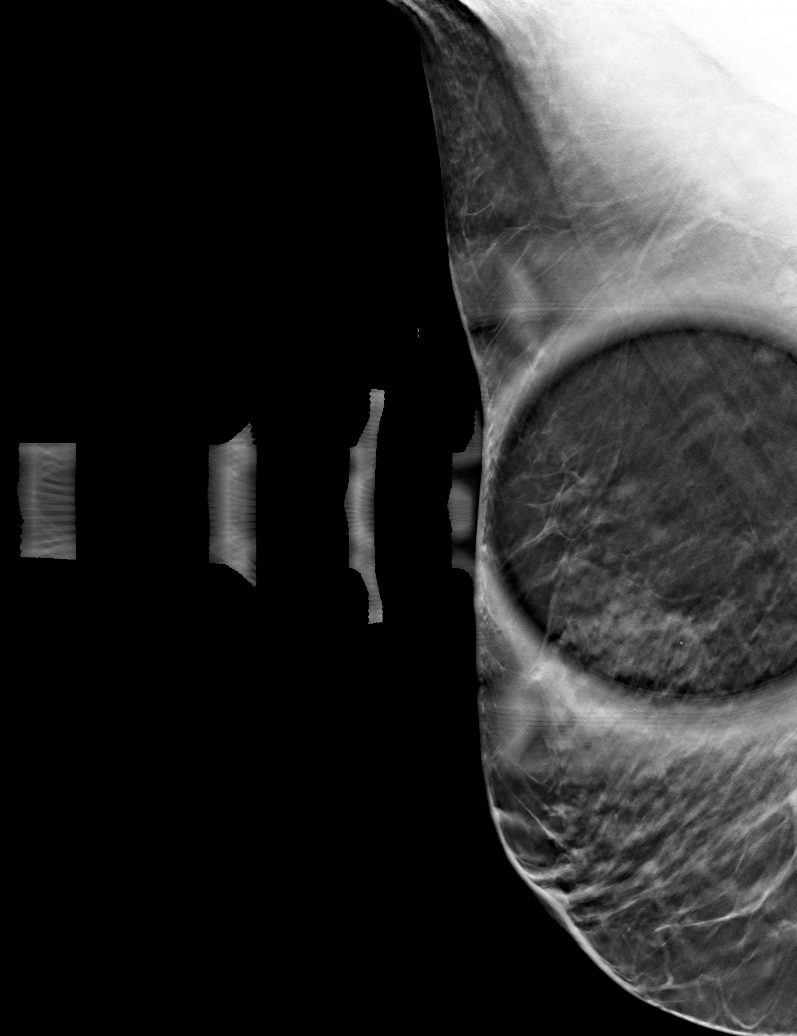

[6 of 18 positions shown; findings below may reference images not displayed]

ACR Breast Density Category c: The breast tissue is heterogeneously
dense, which may obscure small masses.
FINDINGS: Additional tomograms were performed of the right breast. The
initially questioned possible right breast asymmetry resolves on the
additional imaging with findings consistent with an area of
overlapping fibroglandular tissue. There is no mammographic evidence
malignancy in the right.
IMPRESSION: No mammographic evidence of malignancy in the right breast.

RECOMMENDATION:
Screening mammogram in one year.(Code:KR-L-NEX)

I have discussed the findings and recommendations with the patient.
If applicable, a reminder letter will be sent to the patient
regarding the next appointment.

BI-RADS CATEGORY  1: Negative.

## 2022-02-16 ENCOUNTER — Emergency Department (HOSPITAL_COMMUNITY): Payer: Medicare Other

## 2022-02-16 ENCOUNTER — Encounter (HOSPITAL_COMMUNITY): Payer: Self-pay | Admitting: Emergency Medicine

## 2022-02-16 ENCOUNTER — Other Ambulatory Visit: Payer: Self-pay

## 2022-02-16 ENCOUNTER — Emergency Department (HOSPITAL_COMMUNITY)
Admission: EM | Admit: 2022-02-16 | Discharge: 2022-02-16 | Disposition: A | Discharge status: Home / Self Care | Payer: Medicare Other | Attending: Emergency Medicine | Admitting: Emergency Medicine

## 2022-02-16 DIAGNOSIS — Z79899 Other long term (current) drug therapy: Secondary | ICD-10-CM | POA: Diagnosis not present

## 2022-02-16 DIAGNOSIS — E876 Hypokalemia: Secondary | ICD-10-CM | POA: Diagnosis not present

## 2022-02-16 DIAGNOSIS — R002 Palpitations: Secondary | ICD-10-CM | POA: Insufficient documentation

## 2022-02-16 DIAGNOSIS — U071 COVID-19: Secondary | ICD-10-CM | POA: Diagnosis not present

## 2022-02-16 DIAGNOSIS — J439 Emphysema, unspecified: Secondary | ICD-10-CM | POA: Diagnosis not present

## 2022-02-16 DIAGNOSIS — R079 Chest pain, unspecified: Secondary | ICD-10-CM | POA: Insufficient documentation

## 2022-02-16 HISTORY — DX: Anxiety disorder, unspecified: F41.9

## 2022-02-16 LAB — COMPREHENSIVE METABOLIC PANEL
ALT: 13 U/L (ref 0–44)
AST: 17 U/L (ref 15–41)
Albumin: 4.4 g/dL (ref 3.5–5.0)
Alkaline Phosphatase: 69 U/L (ref 38–126)
Anion gap: 9 (ref 5–15)
BUN: 14 mg/dL (ref 8–23)
CO2: 28 mmol/L (ref 22–32)
Calcium: 9.6 mg/dL (ref 8.9–10.3)
Chloride: 103 mmol/L (ref 98–111)
Creatinine, Ser: 0.65 mg/dL (ref 0.44–1.00)
GFR, Estimated: >60 mL/min (ref >60)
Glucose, Bld: 123 mg/dL — ABNORMAL HIGH (ref 70–99)
Potassium: 3.4 mmol/L — ABNORMAL LOW (ref 3.5–5.1)
Sodium: 140 mmol/L (ref 135–145)
Total Bilirubin: 0.9 mg/dL (ref 0.3–1.2)
Total Protein: 7.9 g/dL (ref 6.5–8.1)

## 2022-02-16 LAB — CBC WITH DIFFERENTIAL/PLATELET
Abs Immature Granulocytes: 0.02 10*3/uL (ref 0.00–0.07)
Basophils Absolute: 0.1 10*3/uL (ref 0.0–0.1)
Basophils Relative: 1 %
Eosinophils Absolute: 0.1 10*3/uL (ref 0.0–0.5)
Eosinophils Relative: 1 %
HCT: 43.4 % (ref 36.0–46.0)
Hemoglobin: 15.2 g/dL — ABNORMAL HIGH (ref 12.0–15.0)
Immature Granulocytes: 0 %
Lymphocytes Relative: 28 %
Lymphs Abs: 2.3 10*3/uL (ref 0.7–4.0)
MCH: 32.8 pg (ref 26.0–34.0)
MCHC: 35 g/dL (ref 30.0–36.0)
MCV: 93.5 fL (ref 80.0–100.0)
Monocytes Absolute: 0.6 10*3/uL (ref 0.1–1.0)
Monocytes Relative: 8 %
Neutro Abs: 5.1 10*3/uL (ref 1.7–7.7)
Neutrophils Relative %: 62 %
Platelets: 237 10*3/uL (ref 150–400)
RBC: 4.64 MIL/uL (ref 3.87–5.11)
RDW: 12.4 % (ref 11.5–15.5)
WBC: 8.2 10*3/uL (ref 4.0–10.5)
nRBC: 0 % (ref 0.0–0.2)

## 2022-02-16 LAB — TROPONIN I (HIGH SENSITIVITY): Troponin I (High Sensitivity): 4 ng/L (ref <18)

## 2022-02-16 MED ORDER — POTASSIUM CHLORIDE CRYS ER 20 MEQ PO TBCR: 20.0000 meq | EXTENDED_RELEASE_TABLET | Freq: Every day | ORAL | 0 refills | Status: DC

## 2022-02-16 MED ORDER — HYDROXYZINE HCL 25 MG PO TABS: 25.0000 mg | ORAL_TABLET | Freq: Three times a day (TID) | ORAL | 2 refills | Status: AC | PRN

## 2022-02-16 MED ORDER — POTASSIUM CHLORIDE CRYS ER 20 MEQ PO TBCR
20.0000 meq | EXTENDED_RELEASE_TABLET | Freq: Once | ORAL | Status: AC
  Administered 2022-02-16: 20 meq via ORAL
  Filled 2022-02-16: qty 1

## 2022-02-16 NOTE — ED Triage Notes (Signed)
Pt with multiple concerns that she is having a hard time describing. States "something is not right". Pt states she's not sure if she was having a stroke or not or something wrong with her heart. Feels like her heart is fluttering. States legs have been "hot feeling" and other various symptoms.

## 2022-02-16 NOTE — ED Provider Notes (Signed)
[facility] EMERGENCY DEPARTMENT Provider Note   CSN: [id number] Arrival date & time: 02/16/22  1093     History  Chief Complaint  Patient presents with   Palpitations    Haley Morrow is a 66 y.o. female.  Patient presents to the emergency department for evaluation of chest discomfort.  Patient reports that she was felt like she has been experiencing increased palpitations tonight.  Patient reports that this has been occurring recently.  It normally happens when she is eating.  She reports that she generally eats in her den with her food on the arm of her couch.  When she turns to the left to eat, she feels the fluttering and it generally gets better when she stretches out.  Tonight she has been having intermittent fluttering in general.  She also noticed some warm sensation behind both of her knees and thought it might be an impending stroke.      Home Medications Prior to Admission medications   Medication Sig Start Date End Date Taking? Authorizing Provider  ARIPiprazole (ABILIFY) 5 MG tablet Take 1 tablet (5 mg total) by mouth daily. 02/25/21   Sater, Nanine Means, MD  hydrOXYzine (ATARAX/VISTARIL) 25 MG tablet Take 1 tablet (25 mg total) by mouth every 6 (six) hours. 11/02/20   Henderly, Britni A, PA-C  loratadine (CLARITIN) 10 MG tablet Take 10 mg by mouth daily.    [provider]  losartan (COZAAR) 50 MG tablet Take 50 mg by mouth daily.    [provider]  pantoprazole (PROTONIX) 40 MG tablet NEEDS OFFICE VISIT 11/19/21   Rogene Houston, MD  Prenatal Vit-DSS-Fe Cbn-FA (PRENATAL AD PO) Take by mouth.    [provider]  rosuvastatin (CRESTOR) 10 MG tablet Take 10 mg by mouth every Monday, Wednesday, and Friday.  04/26/20   [provider]      Allergies    Patient has no known allergies.    Review of Systems   Review of Systems  Cardiovascular:  Positive for palpitations.   Physical Exam Updated Vital Signs BP (!) 156/74 (BP  Location: Left Arm)    Pulse 70    Temp 98 F (36.7 C) (Oral)    Resp 20    Ht 5\' 3"  (1.6 m)    Wt 65.8 kg    SpO2 98%    BMI 25.69 kg/m  Physical Exam Vitals and nursing note reviewed.  Constitutional:      General: She is not in acute distress.    Appearance: She is well-developed.  HENT:     Head: Normocephalic and atraumatic.     Mouth/Throat:     Mouth: Mucous membranes are moist.  Eyes:     General: Vision grossly intact. Gaze aligned appropriately.     Extraocular Movements: Extraocular movements intact.     Conjunctiva/sclera: Conjunctivae normal.  Cardiovascular:     Rate and Rhythm: Normal rate and regular rhythm.     Pulses: Normal pulses.     Heart sounds: Normal heart sounds, S1 normal and S2 normal. No murmur heard.   No friction rub. No gallop.  Pulmonary:     Effort: Pulmonary effort is normal. No respiratory distress.     Breath sounds: Normal breath sounds.  Abdominal:     General: Bowel sounds are normal.     Palpations: Abdomen is soft.     Tenderness: There is no abdominal tenderness. There is no guarding or rebound.     Hernia: No hernia  is present.  Musculoskeletal:        General: No swelling.     Cervical back: Full passive range of motion without pain, normal range of motion and neck supple. No spinous process tenderness or muscular tenderness. Normal range of motion.     Right lower leg: No edema.     Left lower leg: No edema.  Skin:    General: Skin is warm and dry.     Capillary Refill: Capillary refill takes less than 2 seconds.     Findings: No ecchymosis, erythema, rash or wound.  Neurological:     General: No focal deficit present.     Mental Status: She is alert and oriented to person, place, and time.     GCS: GCS eye subscore is 4. GCS verbal subscore is 5. GCS motor subscore is 6.     Cranial Nerves: Cranial nerves 2-12 are intact.     Sensory: Sensation is intact.     Motor: Motor function is intact.     Coordination: Coordination is  intact.  Psychiatric:        Attention and Perception: Attention normal.        Mood and Affect: Mood is anxious.        Speech: Speech normal.        Behavior: Behavior normal.    ED Results / Procedures / Treatments   Labs (all labs ordered are listed, but only abnormal results are displayed) Labs Reviewed  CBC WITH DIFFERENTIAL/PLATELET - Abnormal; Notable for the following components:      Result Value   Hemoglobin 15.2 (*)    All other components within normal limits  COMPREHENSIVE METABOLIC PANEL - Abnormal; Notable for the following components:   Potassium 3.4 (*)    Glucose, Bld 123 (*)    All other components within normal limits  TROPONIN I (HIGH SENSITIVITY)    EKG EKG Interpretation  Date/Time:  Monday February 16 2022 02:42:17 EST Ventricular Rate:  69 PR Interval:  123 QRS Duration: 79 QT Interval:  395 QTC Calculation: 424 R Axis:   43 Text Interpretation: Sinus rhythm Abnormal R-wave progression, early transition Confirmed by Orpah Greek [phone]) on 02/16/2022 2:59:00 AM  Radiology DG Chest Port 1 View  Result Date: 02/16/2022 CLINICAL DATA:  Chest discomfort.  Recent COVID. EXAM: PORTABLE CHEST 1 VIEW COMPARISON:  PA Lat 11/02/2020. FINDINGS: There is overlying monitor wiring. The cardiac size is normal. There is an unremarkable mediastinal configuration. Minimal aortic atherosclerosis. The lungs are slightly emphysematous but clear. The sulci are sharp. Mild osteopenia and mild thoracic dextroscoliosis. IMPRESSION: No evidence of acute chest process. Stable COPD chest. Aortic atherosclerosis. Electronically Signed   By: Telford Nab M.D.   On: 02/16/2022 03:23    Procedures Procedures    Medications Ordered in ED Medications - No data to display  ED Course/ Medical Decision Making/ A&P                           Medical Decision Making Amount and/or Complexity of Data Reviewed Labs: ordered. Decision-making details documented in ED  Course. Radiology: ordered and independent interpretation performed. Decision-making details documented in ED Course. ECG/medicine tests: ordered and independent interpretation performed. Decision-making details documented in ED Course.   Patient presents with palpitations.  Patient has been experiencing this on and off for some time but it worsened tonight.  No real chest pain associated.  She is not short of  breath.  Patient sinus rhythm telemetry.  EKG with sinus rhythm, she has not had any arrhythmia here in the department.  Patient does indicate that she had some dizziness and sensation of feeling like she was going to pass out tonight.  She has been monitoring her blood pressure closely and it is generally low, around 765 systolic.  She is actually slightly high here in the department and not orthostatic.  She does appear to be somewhat anxious, reports that her husband died a couple of years ago and she now lives alone.  She became concerned tonight that she might have a heart attack or a stroke.  No indication of either.  Will refer to cardiology for further evaluation of her heart palpitations.  She appears well here and has had a reassuring work-up, does not require hospitalization.        Final Clinical Impression(s) / ED Diagnoses Final diagnoses:  Palpitations    Rx / DC Orders ED Discharge Orders          Ordered    Ambulatory referral to Cardiology        02/16/22 0346              Orpah Greek, MD 02/16/22 [phone]

## 2022-03-06 IMAGING — DX DG CHEST 1V PORT
1 series · 1 of 1 positions shown · non-contrast
Comparison: PA Lat 11/02/2020.

CLINICAL DATA: Chest discomfort.  Recent COVID.

EXAM:
PORTABLE CHEST 1 VIEW

[chest ap]
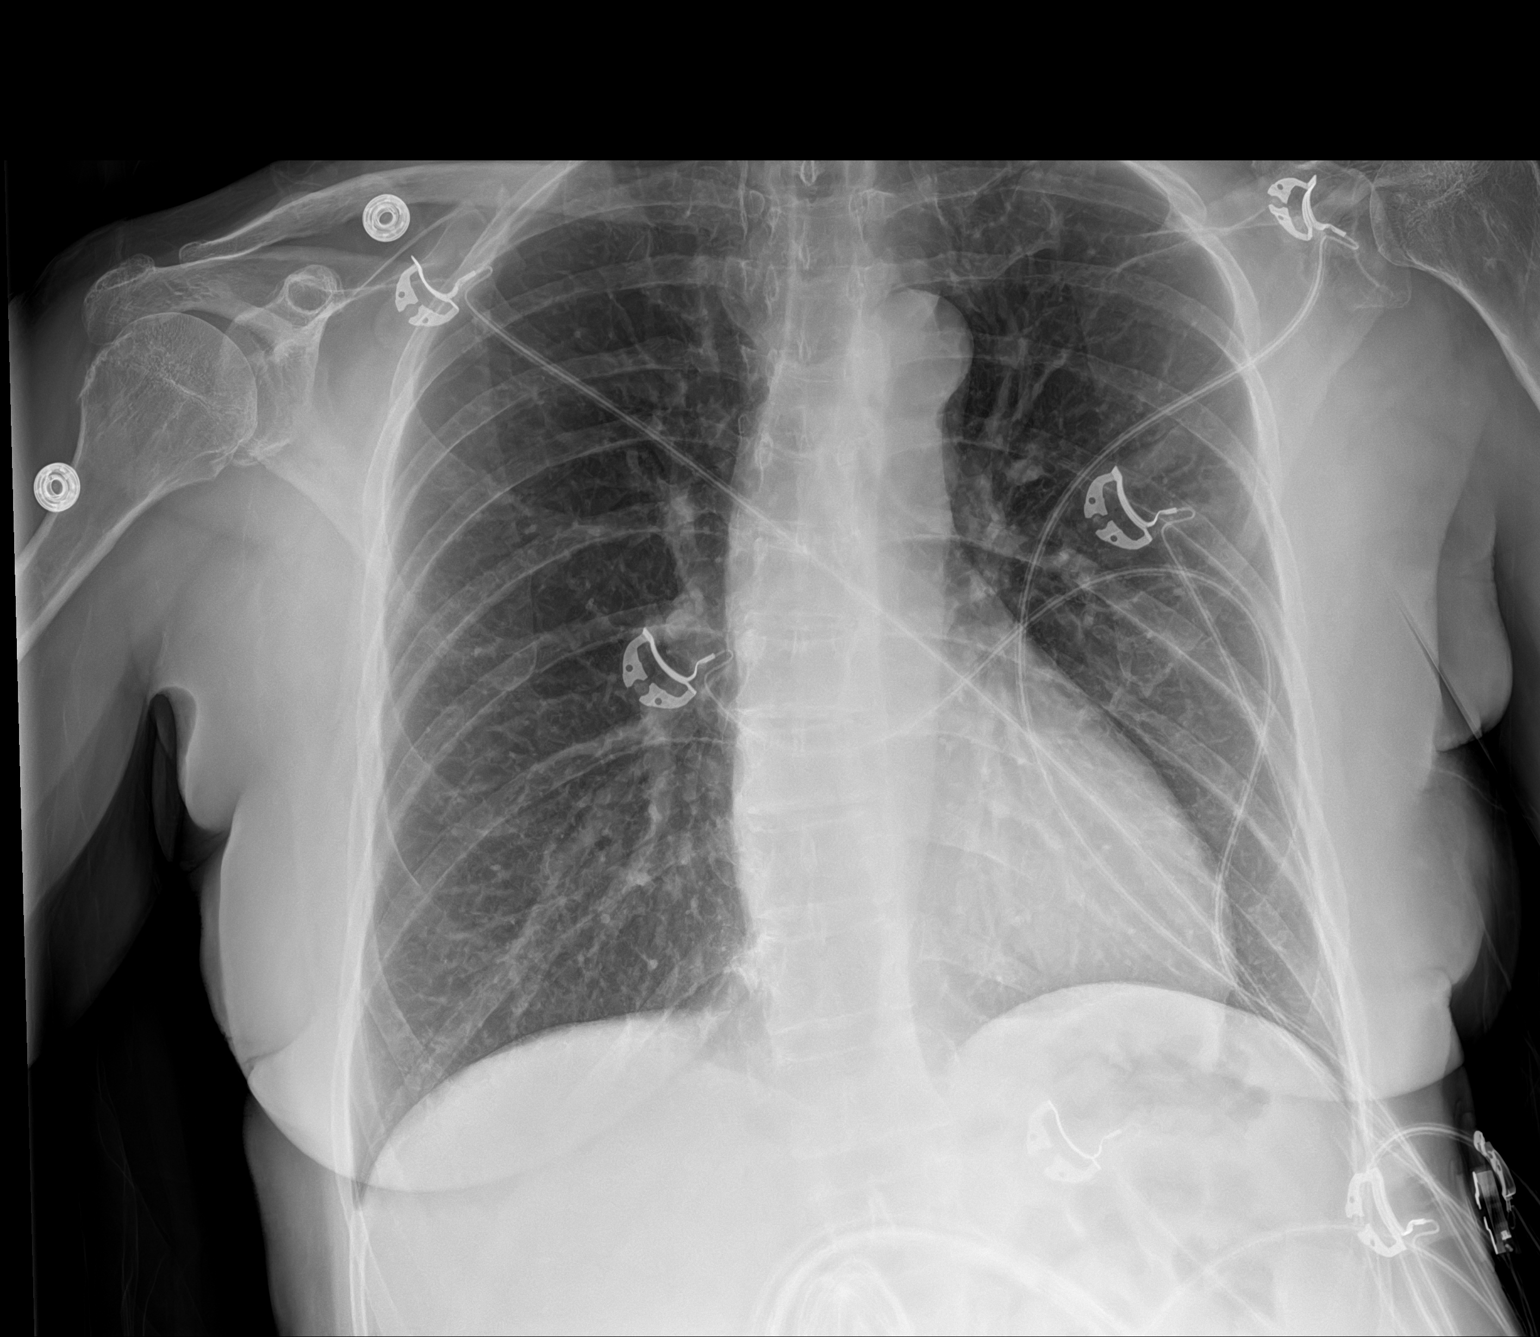

[1 of 1 positions shown; findings below may reference images not displayed]

FINDINGS: There is overlying monitor wiring. The cardiac size is normal. There
is an unremarkable mediastinal configuration. Minimal aortic
atherosclerosis. The lungs are slightly emphysematous but clear. The
sulci are sharp. Mild osteopenia and mild thoracic dextroscoliosis.
IMPRESSION: No evidence of acute chest process. Stable COPD chest. Aortic
atherosclerosis.

## 2022-03-09 ENCOUNTER — Encounter (INDEPENDENT_AMBULATORY_CARE_PROVIDER_SITE_OTHER): Payer: Self-pay | Admitting: Gastroenterology

## 2022-03-09 ENCOUNTER — Other Ambulatory Visit: Payer: Self-pay

## 2022-03-09 ENCOUNTER — Ambulatory Visit (INDEPENDENT_AMBULATORY_CARE_PROVIDER_SITE_OTHER): Payer: Medicare Other | Admitting: Gastroenterology

## 2022-03-09 VITALS — BP 138/88 | HR 87 | Temp 98.0°F | Ht 63.0 in | Wt 143.5 lb

## 2022-03-09 DIAGNOSIS — K219 Gastro-esophageal reflux disease without esophagitis: Secondary | ICD-10-CM | POA: Diagnosis not present

## 2022-03-09 MED ORDER — PANTOPRAZOLE SODIUM 20 MG PO TBEC: 20.0000 mg | DELAYED_RELEASE_TABLET | Freq: Every day | ORAL | 3 refills | Status: DC

## 2022-03-09 NOTE — Patient Instructions (Signed)
Restart pantoprazole 20 mg qday ?If recurrent heartburn, increase to 2 pills per day ?Follow up with cardiology ?

## 2022-03-09 NOTE — Progress Notes (Signed)
[name], M.D. ?Gastroenterology & Hepatology ?[facility] ?[address] ?[place], [place] [address] ? ?Primary Care Physician: ?Celene Squibb, MD ?[place] ?[place] [address] ? ?I will communicate my assessment and recommendations to the referring MD via EMR. ? ?Problems: ?GERD ?Atypical chest pain ? ?History of Present Illness: ?Haley DISE is a 66 y.o. female with medical history of anxiety, hyperlipidemia, hypertension and GERD, who presents for follow up of GERD and atypical chest pain. ? ?The patient was last seen on 09/24/2020. At that time, the patient was advised to increase her pantoprazole to 40 mg once a day, was also advised to decrease the intake of alprazolam. ? ?Patient states she ran out of her medication in December 2022 and did not have more refills. She reports that she has presented some recurrent episodes of lightheadedness recently and has been concerned about this.  She does not know if this is related to her gastrointestinal system.  She has been told this may be related to anxiety.  Patient reports that she will follow with cardiologist next week. ? ?Notably, in the last week of February she had burning chest pain (slightly different form her typical reflux episodes) and itching in her upper chest, which was different from her usual symptoms. Due to this she went to ER 02/16/2022. At that time she underwent blood work-up that showed very mildly decreased potassium of 3.4 the rest of CMP and CBC within normal limits.  Also underwent an EKG and a chest x-ray which did not show any acute abnormalities.She was prescribed oral potassium and was discharged home with referral to cardiology. She states the symptoms resolved on their own. She denies having heartburn recently. ? ?Patient states she had COVID in 01/2022. States was busy taking care of family around the same time so this could be stressful although she did not feel  it was stressful at all. ? ?The patient denies having any nausea, vomiting, fever, chills, hematochezia, melena, hematemesis, abdominal distention, abdominal pain, diarrhea, jaundice, pruritus. Has been eatign less than usual and lsot 8 lb since last visit. She has not been eating as before as she is not eating out and cooking the amount of food she used to cook in the past before her husband passedaway. ? ?Last EGD: 06/17/2020, normal esophagus, few erosions were found in the gastric antrum (pathology with reactive gastropathy negative for H. pylori), normal duodenum ?Last Colonoscopy: 06/17/2020, cecal polyp, diverticulosis, external hemorrhoids (pathology consistent with tubular adenoma).  Recommended to repeat in 7 years. ? ?Past Medical History: ?Past Medical History:  ?Diagnosis Date  ? Anxiety   ? High cholesterol   ? Hypertension   ? ? ?Past Surgical History: ?Past Surgical History:  ?Procedure Laterality Date  ? ABDOMINAL HYSTERECTOMY    ? BIOPSY  06/17/2020  ? Procedure: BIOPSY;  Surgeon: Rogene Houston, MD;  [facility];  Service: Endoscopy;;  ? COLONOSCOPY N/A 06/17/2020  ? Procedure: COLONOSCOPY;  Surgeon: Rogene Houston, MD;  [facility];  Service: Endoscopy;  Laterality: N/A;  955  ? ESOPHAGOGASTRODUODENOSCOPY N/A 06/17/2020  ? Procedure: ESOPHAGOGASTRODUODENOSCOPY (EGD);  Surgeon: Rogene Houston, MD;  [facility];  Service: Endoscopy;  Laterality: N/A;  ? POLYPECTOMY  06/17/2020  ? Procedure: POLYPECTOMY;  Surgeon: Rogene Houston, MD;  [facility];  Service: Endoscopy;;  ? SHOULDER EXAMINAITON UNDER ANESTHESIA W/ INJECTION Left   ? ? ?Family History: ?  Family History  ?Problem Relation Age of Onset  ? CAD Mother   ? Cancer Mother   ? Diabetes Father   ? Stroke Father   ? ? ?Social History: ?Social History  ? ?Tobacco Use  ?Smoking Status Never  ?Smokeless Tobacco Never  ? ?Social History  ? ?Substance and Sexual Activity  ?Alcohol Use Never  ? ?Social  History  ? ?Substance and Sexual Activity  ?Drug Use Never  ? ? ?Allergies: ?No Known Allergies ? ?Medications: ?Current Outpatient Medications  ?Medication Sig Dispense Refill  ? hydrOXYzine (ATARAX) 25 MG tablet Take 1 tablet (25 mg total) by mouth every 8 (eight) hours as needed for anxiety. (Patient taking differently: Take 25 mg by mouth every 8 (eight) hours as needed for anxiety. 1/4 tablet prn) 30 tablet 2  ? loratadine (CLARITIN) 10 MG tablet Take 10 mg by mouth daily.    ? OVER THE COUNTER MEDICATION daily at 6 (six) AM. Vitamin B 12 1000 mcg daily.    ? Prenatal Vit-DSS-Fe Cbn-FA (PRENATAL AD PO) Take by mouth daily at 6 (six) AM.    ? rosuvastatin (CRESTOR) 10 MG tablet Take 10 mg by mouth every Monday, Wednesday, and Friday.     ? ARIPiprazole (ABILIFY) 5 MG tablet Take 1 tablet (5 mg total) by mouth daily. (Patient not taking: Reported on 03/09/2022) 30 tablet 5  ? pantoprazole (PROTONIX) 40 MG tablet NEEDS OFFICE VISIT (Patient not taking: Reported on 03/09/2022) 30 tablet 0  ? ?No current facility-administered medications for this visit.  ? ? ?Review of Systems: ?GENERAL: negative for malaise, night sweats ?HEENT: No changes in hearing or vision, no nose bleeds or other nasal problems. ?NECK: Negative for lumps, goiter, pain and significant neck swelling ?RESPIRATORY: Negative for cough, wheezing ?CARDIOVASCULAR: Negative for chest pain, leg swelling, palpitations, orthopnea ?GI: SEE HPI ?MUSCULOSKELETAL: Negative for joint pain or swelling, back pain, and muscle pain. ?SKIN: Negative for lesions, rash ?PSYCH: Negative for sleep disturbance, mood disorder and recent psychosocial stressors. ?HEMATOLOGY Negative for prolonged bleeding, bruising easily, and swollen nodes. ?ENDOCRINE: Negative for cold or heat intolerance, polyuria, polydipsia and goiter. ?NEURO: negative for tremor, gait imbalance, syncope and seizures. ?The remainder of the review of systems is noncontributory. ? ? ?Physical Exam: ?BP  138/88 (BP Location: Left Arm, Patient Position: Sitting, Cuff Size: Large)   Pulse 87   Temp 98 ?F (36.7 ?C) (Oral)   Ht '5\' 3"'$  (1.6 m)   Wt 143 lb 8 oz (65.1 kg)   BMI 25.42 kg/m?  ?GENERAL: The patient is AO x3, in no acute distress. ?HEENT: Head is normocephalic and atraumatic. EOMI are intact. Mouth is well hydrated and without lesions. ?NECK: Supple. No masses ?LUNGS: Clear to auscultation. No presence of rhonchi/wheezing/rales. Adequate chest expansion ?HEART: RRR, normal s1 and s2. ?ABDOMEN: Soft, nontender, no guarding, no peritoneal signs, and nondistended. BS +. No masses. ?EXTREMITIES: Without any cyanosis, clubbing, rash, lesions or edema. ?NEUROLOGIC: AOx3, no focal motor deficit. ?SKIN: no jaundice, no rashes ? ?Imaging/Labs: ?as above ? ?I personally reviewed and interpreted the available labs, imaging and endoscopic files. ? ?Impression and Plan: ?KONNI KESINGER is a 66 y.o. female with medical history of anxiety, hyperlipidemia, hypertension and GERD, who presents for follow up of GERD and atypical chest pain.  The patient has presented a chronic history of symptoms that were probably related to reflux.  She ran out of her medication a few months ago and did not have any recurrence of her symptoms  but recently she had an episode of atypical chest pain that resolved on its own.  It is possible that this is related to recurrent GERD, for which she will be restarted on Protonix.  As she did not have significant symptoms in between, we will restart her on a lower dose 20 mg every day.  I agree that she will need to follow with her cardiologist to rule out any other cardiac abnormalities given her symptoms, although given her history of anxiety and stressful events, it is possible that her symptoms are related to anxiety. ? ?- Restart pantoprazole 20 mg qday ?- If recurrent heartburn, increase to 40 mg every ?- Follow up with cardiology ? ?All questions were answered.     ? ?Haley Quale, MD ?Gastroenterology and Hepatology ?[facility] ? ?

## 2022-03-12 ENCOUNTER — Encounter: Payer: Self-pay | Admitting: Cardiology

## 2022-03-12 ENCOUNTER — Other Ambulatory Visit: Payer: Self-pay

## 2022-03-12 ENCOUNTER — Ambulatory Visit: Payer: Medicare Other | Admitting: Cardiology

## 2022-03-12 ENCOUNTER — Ambulatory Visit (INDEPENDENT_AMBULATORY_CARE_PROVIDER_SITE_OTHER): Payer: Medicare Other

## 2022-03-12 DIAGNOSIS — E785 Hyperlipidemia, unspecified: Secondary | ICD-10-CM | POA: Diagnosis not present

## 2022-03-12 DIAGNOSIS — R002 Palpitations: Secondary | ICD-10-CM | POA: Insufficient documentation

## 2022-03-12 DIAGNOSIS — I10 Essential (primary) hypertension: Secondary | ICD-10-CM | POA: Diagnosis not present

## 2022-03-12 DIAGNOSIS — R42 Dizziness and giddiness: Secondary | ICD-10-CM | POA: Diagnosis not present

## 2022-03-12 NOTE — Patient Instructions (Signed)
Medication Instructions:  ?Your physician recommends that you continue on your current medications as directed. Please refer to the Current Medication list given to you today. ? ?*If you need a refill on your cardiac medications before your next appointment, please call your pharmacy* ? ? ?Lab Work: ?Your physician recommends that you return for lab work in:  ? ?Labs today: Direct LDL, TSH ? ?If you have labs (blood work) drawn today and your tests are completely normal, you will receive your results only by: ?MyChart Message (if you have MyChart) OR ?A paper copy in the mail ?If you have any lab test that is abnormal or we need to change your treatment, we will call you to review the results. ? ? ?Testing/Procedures: ?Your physician has requested that you have an echocardiogram. Echocardiography is a painless test that uses sound waves to create images of your heart. It provides your doctor with information about the size and shape of your heart and how well your heart?s chambers and valves are working. This procedure takes approximately one hour. There are no restrictions for this procedure. ? ?A zio monitor was ordered today. It will remain on for 14 days. You will then return monitor and event diary in provided box. It takes 1-2 weeks for report to be downloaded and returned to [place]. We will call you with the results. If monitor falls off or has orange flashing light, please call Zio for further instructions.  ? ? ? ?Follow-Up: ?At [facility], you and your health needs are our priority.  As part of our continuing mission to provide you with exceptional heart care, we have created designated Provider Care Teams.  These Care Teams include your primary Cardiologist (physician) and Advanced Practice Providers (APPs -  Physician Assistants and Nurse Practitioners) who all work together to provide you with the care you need, when you need it. ? ?We recommend signing up for the patient portal called "MyChart".  Sign  up information is provided on this After Visit Summary.  MyChart is used to connect with patients for Virtual Visits (Telemedicine).  Patients are able to view lab/test results, encounter notes, upcoming appointments, etc.  Non-urgent messages can be sent to your provider as well.   ?To learn more about what you can do with MyChart, go to [email or web address].   ? ?Your next appointment:   ?3 month(s) ? ?The format for your next appointment:   ?In Person ? ?Provider:   ?Jenne Campus, MD  ? ? ?Other Instructions ?None ? ?

## 2022-03-12 NOTE — Progress Notes (Signed)
? ?Cardiology Consultation:   ? ?Date:  03/12/2022  ? ?ID:  JEYLI ZWICKER, DOB August 26, 1956, MRN [id number] ? ?PCP:  Celene Squibb, MD  ?Cardiologist:  Jenne Campus, MD  ? ?Referring MD: Orpah Greek,*  ? ?Chief Complaint  ?Patient presents with  ? Chest Pain  ? Dizziness  ? ? ?History of Present Illness:   ? ?Haley Morrow is a 66 y.o. female who is being seen today for the evaluation of palpitations and dizziness at the request of Orpah Greek,*.  Past medical history significant for hypertension however lately she had to discontinue her medication because of what appears to be orthostatic hypotension, also history of GERD and dyslipidemia.  She was referred to [place] because she started experiencing some palpitations she describes as fluttering into the left lower portion of her chest that happen in different situations that sensation usually last few minutes and then is gone.  She does not have any sweating associated with this sensation sometimes she gets a little dizzy.  That been happening for a while already but recently became kind of more frequent.  She also described to have some episode of dizziness and it typically happen when she eats a lot and then she sit down she will get very dizzy previously when she was getting up very quickly she will get dizzy but she discontinue her losartan and that sensation is gone.  She is trying to be active but admits that during the winter she does not go outside because it is called 10 days a very short but now trying to develop a more.  She never smoked.  2 years ago she lost her husband that clearly affected her quite significantly, after that she lost significant amount of weight.  She is in the room with her daughter who is a Marine scientist. ?She is not on any special diet ?She does not smoke, never did ? ?Past Medical History:  ?Diagnosis Date  ? Anxiety   ? GERD (gastroesophageal reflux disease) 09/24/2020  ? High cholesterol   ? Hypertension   ?  Weight loss 09/24/2020  ? ? ?Past Surgical History:  ?Procedure Laterality Date  ? BIOPSY  06/17/2020  ? Procedure: BIOPSY;  Surgeon: Rogene Houston, MD;  [facility];  Service: Endoscopy;;  ? COLONOSCOPY N/A 06/17/2020  ? Procedure: COLONOSCOPY;  Surgeon: Rogene Houston, MD;  [facility];  Service: Endoscopy;  Laterality: N/A;  955  ? ESOPHAGOGASTRODUODENOSCOPY N/A 06/17/2020  ? Procedure: ESOPHAGOGASTRODUODENOSCOPY (EGD);  Surgeon: Rogene Houston, MD;  [facility];  Service: Endoscopy;  Laterality: N/A;  ? POLYPECTOMY  06/17/2020  ? Procedure: POLYPECTOMY;  Surgeon: Rogene Houston, MD;  [facility];  Service: Endoscopy;;  ? SHOULDER EXAMINAITON UNDER ANESTHESIA W/ INJECTION Left   ? VAGINAL HYSTERECTOMY    ? ? ?Current Medications: ?Current Meds  ?Medication Sig  ? hydrOXYzine (ATARAX) 25 MG tablet Take 1 tablet (25 mg total) by mouth every 8 (eight) hours as needed for anxiety. (Patient taking differently: Take 25 mg by mouth every 8 (eight) hours as needed for anxiety. 1/4 tablet prn)  ? loratadine (CLARITIN) 10 MG tablet Take 10 mg by mouth daily.  ? OVER THE COUNTER MEDICATION daily at 6 (six) AM. Vitamin B 12 1000 mcg daily.  ? pantoprazole (PROTONIX) 20 MG tablet Take 1 tablet (20 mg total) by mouth daily.  ? Prenatal Vit-DSS-Fe Cbn-FA (PRENATAL AD PO) Take by mouth daily at  6 (six) AM.  ? rosuvastatin (CRESTOR) 10 MG tablet Take 10 mg by mouth every Monday, Wednesday, and Friday.   ?  ? ?Allergies:   Patient has no known allergies.  ? ?Social History  ? ?Socioeconomic History  ? Marital status: Widowed  ?  Spouse name: Not on file  ? Number of children: 3  ? Years of education: 75  ? Highest education level: Not on file  ?Occupational History  ? Not on file  ?Tobacco Use  ? Smoking status: Never  ? Smokeless tobacco: Never  ?Vaping Use  ? Vaping Use: Never used  ?Substance and Sexual Activity  ? Alcohol use: Never  ? Drug use: Never  ? Sexual activity:  Not Currently  ?Other Topics Concern  ? Not on file  ?Social History Narrative  ? Lives alone  ? Right handed  ? Caffeine use: Tea/coffee daily ( 2 each morning), no soda  ? ?Social Determinants of Health  ? ?Financial Resource Strain: Not on file  ?Food Insecurity: Not on file  ?Transportation Needs: Not on file  ?Physical Activity: Not on file  ?Stress: Not on file  ?Social Connections: Not on file  ?  ? ?Family History: ?The patient's family history includes CAD in her mother; Cancer in her mother; Diabetes in her father; Stroke in her father. ?ROS:   ?Please see the history of present illness.    ?All 14 point review of systems negative except as described per history of present illness. ? ?EKGs/Labs/Other Studies Reviewed:   ? ?The following studies were reviewed today: ? ? ?EKG:  EKG is  ordered today.  The ekg ordered today demonstrates normal sinus rhythm, possible left atrial enlargement, no ST segment changes. ? ?Recent Labs: ?02/16/2022: ALT 13; BUN 14; Creatinine, Ser 0.65; Hemoglobin 15.2; Platelets 237; Potassium 3.4; Sodium 140  ?Recent Lipid Panel ?No results found for: CHOL, TRIG, HDL, CHOLHDL, VLDL, LDLCALC, LDLDIRECT ? ?Physical Exam:   ? ?VS:  BP 136/80 (BP Location: Left Arm, Patient Position: Sitting)   Pulse 61   Ht '5\' 3"'$  (1.6 m)   Wt 142 lb (64.4 kg)   SpO2 95%   BMI 25.15 kg/m?    ? ?Wt Readings from Last 3 Encounters:  ?03/12/22 142 lb (64.4 kg)  ?03/09/22 143 lb 8 oz (65.1 kg)  ?02/16/22 145 lb (65.8 kg)  ?  ? ?GEN:  Well nourished, well developed in no acute distress ?HEENT: Normal ?NECK: No JVD; No carotid bruits ?LYMPHATICS: No lymphadenopathy ?CARDIAC: RRR, no murmurs, no rubs, no gallops ?RESPIRATORY:  Clear to auscultation without rales, wheezing or rhonchi  ?ABDOMEN: Soft, non-tender, non-distended ?MUSCULOSKELETAL:  No edema; No deformity  ?SKIN: Warm and dry ?NEUROLOGIC:  Alert and oriented x 3 ?PSYCHIATRIC:  Normal affect  ? ?ASSESSMENT:   ? ?1. Palpitations   ?2. Dizziness    ? ?PLAN:   ? ?In order of problems listed above: ? ?Palpitations.  I will ask her to wear Zio patch for 2 weeks to see if she is if she got any significant arrhythmia.  As a part of evaluation echocardiogram will be done to assess left ventricle ejection fraction.  I also asked her to press the button of Zio patch if she feels any dizziness.  I suspect she simply got high vagal tone after eating this when she feels that way. ?Essential hypertension: Blood pressure seems to well controlled right now without any medications that is another important reason why we should check her echocardiogram to  assess left ventricle ejection fraction. ?Dyslipidemia she is taking Crestor 10 mg daily I do not have any recent fasting lipid profile her I will do the test. ?She was seen in the emergency room on 27 February of this year which I did review the visit.  Her troponin I was normal, I do not see any recent TSH. ? ? ?Medication Adjustments/Labs and Tests Ordered: ?Current medicines are reviewed at length with the patient today.  Concerns regarding medicines are outlined above.  ?No orders of the defined types were placed in this encounter. ? ?No orders of the defined types were placed in this encounter. ? ? ?Signed, ?Park Liter, MD, [facility]. ?03/12/2022 3:25 PM    ?[place] ?

## 2022-03-13 LAB — LDL CHOLESTEROL, DIRECT: LDL Direct: 93 mg/dL (ref 0–99)

## 2022-03-13 LAB — TSH: TSH: 1.13 u[IU]/mL (ref 0.450–4.500)

## 2022-03-26 ENCOUNTER — Telehealth: Payer: Self-pay | Admitting: Cardiology

## 2022-03-26 DIAGNOSIS — E785 Hyperlipidemia, unspecified: Secondary | ICD-10-CM

## 2022-03-26 NOTE — Telephone Encounter (Signed)
Patient returned call for her lab results. °

## 2022-03-26 NOTE — Telephone Encounter (Signed)
Spoke with pt regarding lab results. She is taking Crestor '10mg'$  M-W-F and not daily. She would like to know if she should go to Crestor '10mg'$  daily first before increasing to '20mg'$ ? Note sent to Dr. Agustin Cree for advice. Lab request submitted for Lipids, AST, ALT in 6 weeks. ?

## 2022-03-31 ENCOUNTER — Ambulatory Visit (HOSPITAL_BASED_OUTPATIENT_CLINIC_OR_DEPARTMENT_OTHER): Admission: RE | Admit: 2022-03-31 | Payer: Medicare Other | Source: Ambulatory Visit

## 2022-04-07 ENCOUNTER — Ambulatory Visit (HOSPITAL_BASED_OUTPATIENT_CLINIC_OR_DEPARTMENT_OTHER)
Admission: RE | Admit: 2022-04-07 | Discharge: 2022-04-07 | Disposition: A | Discharge status: Home / Self Care | Payer: Medicare Other | Source: Ambulatory Visit | Attending: Cardiology | Admitting: Cardiology

## 2022-04-07 DIAGNOSIS — R002 Palpitations: Secondary | ICD-10-CM | POA: Diagnosis not present

## 2022-04-07 DIAGNOSIS — R0609 Other forms of dyspnea: Secondary | ICD-10-CM | POA: Diagnosis not present

## 2022-04-07 DIAGNOSIS — E785 Hyperlipidemia, unspecified: Secondary | ICD-10-CM | POA: Insufficient documentation

## 2022-04-07 DIAGNOSIS — R42 Dizziness and giddiness: Secondary | ICD-10-CM | POA: Insufficient documentation

## 2022-04-07 DIAGNOSIS — I10 Essential (primary) hypertension: Secondary | ICD-10-CM | POA: Diagnosis not present

## 2022-04-07 LAB — ECHOCARDIOGRAM COMPLETE
AR max vel: 1.91 cm2
AV Area VTI: 1.99 cm2
AV Area mean vel: 2 cm2
AV Mean grad: 3 mmHg
AV Peak grad: 5.3 mmHg
Ao pk vel: 1.15 m/s
Area-P 1/2: 4.63 cm2
S' Lateral: 2.8 cm

## 2022-04-07 NOTE — Progress Notes (Incomplete)
?  Echocardiogram ?2D Echocardiogram has been performed. ? ?Elmer Ramp ?04/07/2022, 10:30 AM ?

## 2022-04-10 ENCOUNTER — Telehealth: Payer: Self-pay

## 2022-04-10 NOTE — Telephone Encounter (Signed)
Patient notified of results.

## 2022-04-10 NOTE — Telephone Encounter (Signed)
-----   Message from Park Liter, MD sent at 04/09/2022 12:35 PM EDT ----- ?Echocardiogram showed preserved left ventricle ejection fraction, mild to moderate mitral valve regurgitation, no needs to do anything aggressive chest medications that we will continue ?

## 2022-04-23 ENCOUNTER — Telehealth: Payer: Self-pay

## 2022-04-23 MED ORDER — METOPROLOL TARTRATE 25 MG PO TABS: 25.0000 mg | ORAL_TABLET | Freq: Two times a day (BID) | ORAL | 2 refills | Status: DC

## 2022-04-23 NOTE — Telephone Encounter (Signed)
-----   Message from Park Liter, MD sent at 04/22/2022  9:18 PM EDT ----- ?Multiple episode of supraventricular tachycardia please start metoprolol titrate 25 twice daily ?

## 2022-04-23 NOTE — Telephone Encounter (Signed)
Patient notified of results and recommendations and agreed with plan. Rx sent.  

## 2022-05-01 DIAGNOSIS — I129 Hypertensive chronic kidney disease with stage 1 through stage 4 chronic kidney disease, or unspecified chronic kidney disease: Secondary | ICD-10-CM | POA: Diagnosis not present

## 2022-05-01 DIAGNOSIS — R7301 Impaired fasting glucose: Secondary | ICD-10-CM | POA: Diagnosis not present

## 2022-05-01 DIAGNOSIS — E782 Mixed hyperlipidemia: Secondary | ICD-10-CM | POA: Diagnosis not present

## 2022-05-05 DIAGNOSIS — E782 Mixed hyperlipidemia: Secondary | ICD-10-CM | POA: Diagnosis not present

## 2022-05-05 DIAGNOSIS — E870 Hyperosmolality and hypernatremia: Secondary | ICD-10-CM | POA: Diagnosis not present

## 2022-05-05 DIAGNOSIS — F329 Major depressive disorder, single episode, unspecified: Secondary | ICD-10-CM | POA: Diagnosis not present

## 2022-05-05 DIAGNOSIS — I129 Hypertensive chronic kidney disease with stage 1 through stage 4 chronic kidney disease, or unspecified chronic kidney disease: Secondary | ICD-10-CM | POA: Diagnosis not present

## 2022-06-30 ENCOUNTER — Encounter: Payer: Self-pay | Admitting: Cardiology

## 2022-06-30 ENCOUNTER — Ambulatory Visit: Payer: Medicare Other | Admitting: Cardiology

## 2022-06-30 VITALS — BP 142/78 | HR 65 | Ht 63.0 in | Wt 151.0 lb

## 2022-06-30 DIAGNOSIS — I471 Supraventricular tachycardia, unspecified: Secondary | ICD-10-CM | POA: Insufficient documentation

## 2022-06-30 DIAGNOSIS — E785 Hyperlipidemia, unspecified: Secondary | ICD-10-CM

## 2022-06-30 DIAGNOSIS — R002 Palpitations: Secondary | ICD-10-CM

## 2022-06-30 DIAGNOSIS — I10 Essential (primary) hypertension: Secondary | ICD-10-CM

## 2022-06-30 DIAGNOSIS — R42 Dizziness and giddiness: Secondary | ICD-10-CM | POA: Diagnosis not present

## 2022-06-30 MED ORDER — METOPROLOL SUCCINATE ER 50 MG PO TB24: 50.0000 mg | ORAL_TABLET | Freq: Every day | ORAL | 3 refills | Status: DC

## 2022-06-30 NOTE — Progress Notes (Signed)
Cardiology Office Note:    Date:  06/30/2022   ID:  Haley Morrow, DOB Jul 29, 1956, MRN [id number]  PCP:  Celene Squibb, MD  Cardiologist:  Jenne Campus, MD    Referring MD: Celene Squibb, MD   Chief Complaint  Patient presents with   Follow-up  Doing better with medications  History of Present Illness:    Haley Morrow is a 66 y.o. female with past medical history significant for essential hypertension, GERD, dizziness, she was referred to [place] because of dizziness and palpitations.  She wore a monitor which showed frequent episode of supraventricular tachycardia.  She was put on a small dose of beta-blocker with good response.  Echocardiogram has also been performed which showed preserved normal left ventricular ejection fraction.  Overall she is coming today to my office doing much better.  Palpitation almost completely subsided she is interestingly gained some weight and she is on a good symptomatic  Past Medical History:  Diagnosis Date   Anxiety    GERD (gastroesophageal reflux disease) 09/24/2020   High cholesterol    Hypertension    Weight loss 09/24/2020    Past Surgical History:  Procedure Laterality Date   BIOPSY  06/17/2020   Procedure: BIOPSY;  Surgeon: Rogene Houston, MD;  [facility];  Service: Endoscopy;;   COLONOSCOPY N/A 06/17/2020   Procedure: COLONOSCOPY;  Surgeon: Rogene Houston, MD;  [facility];  Service: Endoscopy;  Laterality: N/A;  955   ESOPHAGOGASTRODUODENOSCOPY N/A 06/17/2020   Procedure: ESOPHAGOGASTRODUODENOSCOPY (EGD);  Surgeon: Rogene Houston, MD;  [facility];  Service: Endoscopy;  Laterality: N/A;   POLYPECTOMY  06/17/2020   Procedure: POLYPECTOMY;  Surgeon: Rogene Houston, MD;  [facility];  Service: Endoscopy;;   SHOULDER EXAMINAITON UNDER ANESTHESIA W/ INJECTION Left    VAGINAL HYSTERECTOMY      Current Medications: Current Meds  Medication Sig   hydrOXYzine (ATARAX) 25 MG  tablet Take 1 tablet (25 mg total) by mouth every 8 (eight) hours as needed for anxiety. (Patient taking differently: Take 6.25 mg by mouth every 8 (eight) hours as needed for anxiety. 1/4 tablet prn)   loratadine (CLARITIN) 10 MG tablet Take 10 mg by mouth daily.   metoprolol tartrate (LOPRESSOR) 25 MG tablet Take 1 tablet (25 mg total) by mouth 2 (two) times daily.   OVER THE COUNTER MEDICATION Take 1 tablet by mouth daily at 6 (six) AM. Vitamin B 12 1000 mcg daily.   pantoprazole (PROTONIX) 20 MG tablet Take 1 tablet (20 mg total) by mouth daily.   Prenatal Vit-DSS-Fe Cbn-FA (PRENATAL AD PO) Take 1 tablet by mouth daily at 6 (six) AM.   rosuvastatin (CRESTOR) 10 MG tablet Take 10 mg by mouth daily.     Allergies:   Patient has no known allergies.   Social History   Socioeconomic History   Marital status: Widowed    Spouse name: Not on file   Number of children: 3   Years of education: 39   Highest education level: Not on file  Occupational History   Not on file  Tobacco Use   Smoking status: Never   Smokeless tobacco: Never  Vaping Use   Vaping Use: Never used  Substance and Sexual Activity   Alcohol use: Never   Drug use: Never   Sexual activity: Not Currently  Other Topics Concern   Not on file  Social History Narrative   Lives alone  Right handed   Caffeine use: Tea/coffee daily ( 2 each morning), no soda   Social Determinants of Health   Financial Resource Strain: Not on file  Food Insecurity: Not on file  Transportation Needs: Not on file  Physical Activity: Not on file  Stress: Not on file  Social Connections: Not on file     Family History: The patient's family history includes CAD in her mother; Cancer in her mother; Diabetes in her father; Stroke in her father. ROS:   Please see the history of present illness.    All 14 point review of systems negative except as described per history of present illness  EKGs/Labs/Other Studies Reviewed:       Recent Labs: 02/16/2022: ALT 13; BUN 14; Creatinine, Ser 0.65; Hemoglobin 15.2; Platelets 237; Potassium 3.4; Sodium 140 03/12/2022: TSH 1.130  Recent Lipid Panel    Component Value Date/Time   LDLDIRECT 93 03/12/2022 1602    Physical Exam:    VS:  BP (!) 142/78 (BP Location: Left Arm, Patient Position: Sitting)   Pulse 65   Ht '5\' 3"'$  (1.6 m)   Wt 151 lb 0.6 oz (68.5 kg)   SpO2 99%   BMI 26.76 kg/m     Wt Readings from Last 3 Encounters:  06/30/22 151 lb 0.6 oz (68.5 kg)  03/12/22 142 lb (64.4 kg)  03/09/22 143 lb 8 oz (65.1 kg)     GEN:  Well nourished, well developed in no acute distress HEENT: Normal NECK: No JVD; No carotid bruits LYMPHATICS: No lymphadenopathy CARDIAC: RRR, no murmurs, no rubs, no gallops RESPIRATORY:  Clear to auscultation without rales, wheezing or rhonchi  ABDOMEN: Soft, non-tender, non-distended MUSCULOSKELETAL:  No edema; No deformity  SKIN: Warm and dry LOWER EXTREMITIES: no swelling NEUROLOGIC:  Alert and oriented x 3 PSYCHIATRIC:  Normal affect   ASSESSMENT:    1. Essential hypertension   2. SVT (supraventricular tachycardia) (HCC)   3. Palpitations   4. Dizziness   5. Dyslipidemia    PLAN:    In order of problems listed above:  Supraventricular tachycardia which is new diagnosis.  Successfully managed with beta-blocker.  I will switch her to long-acting metoprolol.  She will be metoprolol succinate 50 mg daily Dyslipidemia she is on Crestor 10 and she does not think that cholesterol results with LDL of 88 we will continue present management. Essential hypertension blood pressure controlled continue present management. Dizziness subsided.   Medication Adjustments/Labs and Tests Ordered: Current medicines are reviewed at length with the patient today.  Concerns regarding medicines are outlined above.  No orders of the defined types were placed in this encounter.  Medication changes: No orders of the defined types were  placed in this encounter.   Signed, Park Liter, MD, [facility] 06/30/2022 3:25 PM    [place]

## 2022-06-30 NOTE — Addendum Note (Signed)
Addended by: Jacobo Forest D on: 06/30/2022 03:59 PM   Modules accepted: Orders

## 2022-06-30 NOTE — Patient Instructions (Addendum)
Medication Instructions:  Your physician has recommended you make the following change in your medication: Stop Metoprolol Tartrate  Start: Metoprolol Succinate '50mg'$  daily by mouth    Lab Work: None Ordered If you have labs (blood work) drawn today and your tests are completely normal, you will receive your results only by: [place] (if you have MyChart) OR A paper copy in the mail If you have any lab test that is abnormal or we need to change your treatment, we will call you to review the results.   Testing/Procedures: None Ordered   Follow-Up: At [facility], you and your health needs are our priority.  As part of our continuing mission to provide you with exceptional heart care, we have created designated Provider Care Teams.  These Care Teams include your primary Cardiologist (physician) and Advanced Practice Providers (APPs -  Physician Assistants and Nurse Practitioners) who all work together to provide you with the care you need, when you need it.  We recommend signing up for the patient portal called "MyChart".  Sign up information is provided on this After Visit Summary.  MyChart is used to connect with patients for Virtual Visits (Telemedicine).  Patients are able to view lab/test results, encounter notes, upcoming appointments, etc.  Non-urgent messages can be sent to your provider as well.   To learn more about what you can do with MyChart, go to [email or web address].    Your next appointment:   6 month(s)  The format for your next appointment:   In Person  Provider:   Jenne Campus, MD    Other Instructions NA

## 2022-07-08 ENCOUNTER — Telehealth: Payer: Self-pay | Admitting: Cardiology

## 2022-07-08 NOTE — Telephone Encounter (Signed)
Pt c/o medication issue:  1. Name of Medication:   metoprolol succinate (TOPROL-XL) 50 MG 24 hr tablet    2. How are you currently taking this medication (dosage and times per day)?  Take 1 tablet (50 mg total) by mouth daily. Take with or  3. Are you having a reaction (difficulty breathing--STAT)? No  4. What is your medication issue? Pt states that medication has been making her "shaky", have an upset stomach and causing her BP to drop ( 111/49, 112/54). Pt would like to know if she can go back to the dosage that she was taking previously. Please advise

## 2022-07-08 NOTE — Telephone Encounter (Signed)
Pt is going to restart Metoprolol tartrate 25 BID. Pt states since she has been on Metoprolol succinate she has felt bad. Please advise upon your return. Pt will keep a BP/HR log too.

## 2022-07-20 ENCOUNTER — Other Ambulatory Visit: Payer: Self-pay | Admitting: Cardiology

## 2022-07-24 ENCOUNTER — Other Ambulatory Visit: Payer: Self-pay

## 2022-07-24 ENCOUNTER — Telehealth: Payer: Self-pay | Admitting: Cardiology

## 2022-07-24 MED ORDER — METOPROLOL SUCCINATE ER 50 MG PO TB24: 50.0000 mg | ORAL_TABLET | Freq: Every day | ORAL | 3 refills | Status: DC

## 2022-07-24 NOTE — Telephone Encounter (Signed)
*  STAT* If patient is at the pharmacy, call can be transferred to refill team.   1. Which medications need to be refilled? (please list name of each medication and dose if known) metoprolol succinate (TOPROL-XL) 50 MG 24 hr tablet  2. Which pharmacy/location (including street and city if local pharmacy) is medication to be sent to? [facility] [phone] - [place], [address] AT [place]  3. Do they need a 30 day or 90 day supply? [place]

## 2022-07-24 NOTE — Telephone Encounter (Signed)
Ref #[id number] ref x 3 metoprolol succinate (TOPROL-XL) 50 MG 24 hr tablet

## 2022-07-27 NOTE — Telephone Encounter (Signed)
Spoke to pt who stated that she has taken Toprol- XL 50 mg tablets for 1 week and has felt "shaky" daily. Pt stated that she has felt overall horrible while taking medication and stated that she has a constant stomach ache and loose stools. Pt stated that her bp had dropped: 99/59 112/54 110/56 105/47  Pt is requesting to go back on Metoprolol Tartrate 25 mg tablets.  Please advise.

## 2022-07-27 NOTE — Telephone Encounter (Signed)
Pt c/o medication issue:  1. Name of Medication: metoprolol succinate (TOPROL-XL) 50 MG 24 hr tablet  2. How are you currently taking this medication (dosage and times per day)?   3. Are you having a reaction (difficulty breathing--STAT)?   4. What is your medication issue? Pt states she can not continue to take this medication and to please call in the Metoprolol tartrate 25. Requesting call back to discuss this.

## 2022-07-28 ENCOUNTER — Telehealth: Payer: Self-pay

## 2022-07-28 MED ORDER — METOPROLOL TARTRATE 25 MG PO TABS: 25.0000 mg | ORAL_TABLET | Freq: Two times a day (BID) | ORAL | 3 refills | Status: DC

## 2022-07-28 NOTE — Telephone Encounter (Signed)
Called Metoprolol tartrate BID to pharmacy. Pt unable to tolerate metoprolol Succinate.

## 2022-10-05 DIAGNOSIS — Z23 Encounter for immunization: Secondary | ICD-10-CM | POA: Diagnosis not present

## 2022-10-05 DIAGNOSIS — I471 Supraventricular tachycardia, unspecified: Secondary | ICD-10-CM | POA: Diagnosis not present

## 2022-10-05 DIAGNOSIS — M25562 Pain in left knee: Secondary | ICD-10-CM | POA: Diagnosis not present

## 2022-10-12 DIAGNOSIS — M25562 Pain in left knee: Secondary | ICD-10-CM | POA: Diagnosis not present

## 2022-10-14 DIAGNOSIS — H5213 Myopia, bilateral: Secondary | ICD-10-CM | POA: Diagnosis not present

## 2022-10-15 ENCOUNTER — Ambulatory Visit: Payer: Medicare Other | Admitting: Internal Medicine

## 2022-10-27 DIAGNOSIS — C44319 Basal cell carcinoma of skin of other parts of face: Secondary | ICD-10-CM | POA: Diagnosis not present

## 2022-10-27 DIAGNOSIS — L439 Lichen planus, unspecified: Secondary | ICD-10-CM | POA: Diagnosis not present

## 2022-10-27 DIAGNOSIS — C44612 Basal cell carcinoma of skin of right upper limb, including shoulder: Secondary | ICD-10-CM | POA: Diagnosis not present

## 2022-10-27 DIAGNOSIS — L814 Other melanin hyperpigmentation: Secondary | ICD-10-CM | POA: Diagnosis not present

## 2022-10-27 DIAGNOSIS — L821 Other seborrheic keratosis: Secondary | ICD-10-CM | POA: Diagnosis not present

## 2022-10-27 DIAGNOSIS — Z08 Encounter for follow-up examination after completed treatment for malignant neoplasm: Secondary | ICD-10-CM | POA: Diagnosis not present

## 2022-10-27 DIAGNOSIS — D1801 Hemangioma of skin and subcutaneous tissue: Secondary | ICD-10-CM | POA: Diagnosis not present

## 2022-11-05 DIAGNOSIS — I10 Essential (primary) hypertension: Secondary | ICD-10-CM | POA: Diagnosis not present

## 2022-11-05 DIAGNOSIS — E1165 Type 2 diabetes mellitus with hyperglycemia: Secondary | ICD-10-CM | POA: Diagnosis not present

## 2022-11-05 DIAGNOSIS — R7301 Impaired fasting glucose: Secondary | ICD-10-CM | POA: Diagnosis not present

## 2022-11-09 DIAGNOSIS — F329 Major depressive disorder, single episode, unspecified: Secondary | ICD-10-CM | POA: Diagnosis not present

## 2022-11-09 DIAGNOSIS — Z0000 Encounter for general adult medical examination without abnormal findings: Secondary | ICD-10-CM | POA: Diagnosis not present

## 2022-11-09 DIAGNOSIS — I129 Hypertensive chronic kidney disease with stage 1 through stage 4 chronic kidney disease, or unspecified chronic kidney disease: Secondary | ICD-10-CM | POA: Diagnosis not present

## 2022-11-09 DIAGNOSIS — E782 Mixed hyperlipidemia: Secondary | ICD-10-CM | POA: Diagnosis not present

## 2022-11-25 DIAGNOSIS — C44612 Basal cell carcinoma of skin of right upper limb, including shoulder: Secondary | ICD-10-CM | POA: Diagnosis not present

## 2022-11-25 DIAGNOSIS — L905 Scar conditions and fibrosis of skin: Secondary | ICD-10-CM | POA: Diagnosis not present

## 2022-12-30 ENCOUNTER — Ambulatory Visit: Payer: Medicare Other | Admitting: Internal Medicine

## 2023-01-06 ENCOUNTER — Encounter: Payer: Self-pay | Admitting: Internal Medicine

## 2023-01-06 ENCOUNTER — Ambulatory Visit: Payer: Medicare Other | Attending: Internal Medicine | Admitting: Internal Medicine

## 2023-01-06 VITALS — BP 146/98 | HR 66 | Ht 63.0 in | Wt 157.4 lb

## 2023-01-06 DIAGNOSIS — I471 Supraventricular tachycardia, unspecified: Secondary | ICD-10-CM

## 2023-01-06 DIAGNOSIS — I340 Nonrheumatic mitral (valve) insufficiency: Secondary | ICD-10-CM | POA: Insufficient documentation

## 2023-01-06 NOTE — Patient Instructions (Signed)
Medication Instructions:  Your physician recommends that you continue on your current medications as directed. Please refer to the Current Medication list given to you today.   Labwork: None  Testing/Procedures: None  Follow-Up: Follow up with Dr. Mallipeddi in 1 year.   Any Other Special Instructions Will Be Listed Below (If Applicable).     If you need a refill on your cardiac medications before your next appointment, please call your pharmacy.  

## 2023-01-06 NOTE — Progress Notes (Signed)
Cardiology Office Note  Date: 01/06/2023   ID: Haley Morrow, DOB 1956/09/20, MRN [id number]  PCP:  Celene Squibb, MD  Cardiologist:  None Electrophysiologist:  None   Reason for Office Visit: Management of SVT   History of Present Illness: Haley Morrow is a 67 y.o. female known to have HTN, HLD, SVT presented to cardiology clinic for follow-up visit.  Patient lost her husband 3 years ago after which she started to have palpitations.  She was eventually referred to EP clinic in 02/2022 for evaluation of palpitations. She underwent event monitor which showed 756 episodes of SVT, symptomatic for which she was started on metoprolol tartrate 25 mg twice daily with significant improvement in her symptoms.  She was later switched to metoprolol succinate 50 mg once daily but she continued to have dizziness and hence was switched back to metoprolol tartrate 25 mg twice daily.  She was also diagnosed with HTN in the past but was not on any medications due to orthostatic hypotension and dizziness concerns. She presented today for follow-up visit.  She is currently on metoprolol tartrate 25 mg twice daily and has no palpitations. She reported having dizziness before she eats any meal but the dizziness disappears after she continues to eat. Denied any presyncope or syncope. Denied any angina, SOB, palpitations, leg swelling.  Patient brought home BP and HR log, BP was ranging in 100s mm Hg SBP and HR late 50s to 60s.  Past Medical History:  Diagnosis Date   Anxiety    GERD (gastroesophageal reflux disease) 09/24/2020   High cholesterol    Hypertension    Weight loss 09/24/2020    Past Surgical History:  Procedure Laterality Date   BIOPSY  06/17/2020   Procedure: BIOPSY;  Surgeon: Rogene Houston, MD;  [facility];  Service: Endoscopy;;   COLONOSCOPY N/A 06/17/2020   Procedure: COLONOSCOPY;  Surgeon: Rogene Houston, MD;  [facility];  Service: Endoscopy;   Laterality: N/A;  955   ESOPHAGOGASTRODUODENOSCOPY N/A 06/17/2020   Procedure: ESOPHAGOGASTRODUODENOSCOPY (EGD);  Surgeon: Rogene Houston, MD;  [facility];  Service: Endoscopy;  Laterality: N/A;   POLYPECTOMY  06/17/2020   Procedure: POLYPECTOMY;  Surgeon: Rogene Houston, MD;  [facility];  Service: Endoscopy;;   SHOULDER EXAMINAITON UNDER ANESTHESIA W/ INJECTION Left    VAGINAL HYSTERECTOMY      Current Outpatient Medications  Medication Sig Dispense Refill   hydrOXYzine (ATARAX) 25 MG tablet Take 1 tablet (25 mg total) by mouth every 8 (eight) hours as needed for anxiety. (Patient taking differently: Take 6.25 mg by mouth every 8 (eight) hours as needed for anxiety. 1/4 tablet prn) 30 tablet 2   loratadine (CLARITIN) 10 MG tablet Take 10 mg by mouth daily.     metoprolol tartrate (LOPRESSOR) 25 MG tablet Take 1 tablet (25 mg total) by mouth 2 (two) times daily. 180 tablet 3   OVER THE COUNTER MEDICATION Take 1 tablet by mouth daily at 6 (six) AM. Vitamin B 12 1000 mcg daily.     pantoprazole (PROTONIX) 20 MG tablet Take 1 tablet (20 mg total) by mouth daily. 90 tablet 3   Prenatal Vit-DSS-Fe Cbn-FA (PRENATAL AD PO) Take 1 tablet by mouth daily at 6 (six) AM.     rosuvastatin (CRESTOR) 10 MG tablet Take 10 mg by mouth daily.     No current facility-administered medications for this visit.   Allergies:  Patient has no known  allergies.   Social History: The patient  reports that she has never smoked. She has never used smokeless tobacco. She reports that she does not drink alcohol and does not use drugs.   Family History: The patient's family history includes CAD in her mother; Cancer in her mother; Diabetes in her father; Stroke in her father.   ROS:  Please see the history of present illness. Otherwise, complete review of systems is positive for none.  All other systems are reviewed and negative.   Physical Exam: VS:  Ht '5\' 3"'$  (1.6 m)   Wt 157 lb 6.4 oz  (71.4 kg)   SpO2 97%   BMI 27.88 kg/m , BMI Body mass index is 27.88 kg/m.  Wt Readings from Last 3 Encounters:  01/06/23 157 lb 6.4 oz (71.4 kg)  06/30/22 151 lb 0.6 oz (68.5 kg)  03/12/22 142 lb (64.4 kg)    General: Patient appears comfortable at rest. HEENT: Conjunctiva and lids normal, oropharynx clear with moist mucosa. Neck: Supple, no elevated JVP or carotid bruits, no thyromegaly. Lungs: Clear to auscultation, nonlabored breathing at rest. Cardiac: Regular rate and rhythm, no S3 or significant systolic murmur, no pericardial rub. Abdomen: Soft, nontender, no hepatomegaly, bowel sounds present, no guarding or rebound. Extremities: No pitting edema, distal pulses 2+. Skin: Warm and dry. Musculoskeletal: No kyphosis. Neuropsychiatric: Alert and oriented x3, affect grossly appropriate.  ECG:  An ECG dated 01/06/2023 was personally reviewed today and demonstrated:  Normal sinus rhythm and no ST-T changes  Recent Labwork: 02/16/2022: ALT 13; AST 17; BUN 14; Creatinine, Ser 0.65; Hemoglobin 15.2; Platelets 237; Potassium 3.4; Sodium 140 03/12/2022: TSH 1.130     Component Value Date/Time   LDLDIRECT 93 03/12/2022 1602    Other Studies Reviewed Today: Echo in 03/2022 LVEF normal Pseudo normal diastolic pattern Mild to moderate MR  Assessment and Plan: Patient is a 67 year old F known to have HTN, HLD, SVT presented to cardiology clinic for follow-up visit.  # SVT -Continue metoprolol tartrate 25 mg twice daily -Patient has dizziness prior to eating meal but heart rate stayed in late 50s to 60s. Her dizziness might be secondary to poor p.o. diet and fluid intake which I educated her to take healthy meals and drink 2L of water per day.  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  No myalgias. -Management of HLD per PCP.  I have spent a total of 33 minutes with patient  reviewing chart, EKGs, labs and examining patient as well as establishing an assessment and plan that was discussed with the patient.  > 50% of time was spent in direct patient care.     Medication Adjustments/Labs and Tests Ordered: Current medicines are reviewed at length with the patient today.  Concerns regarding medicines are outlined above.   Tests Ordered: Orders Placed This Encounter  Procedures   EKG 12-Lead    Medication Changes: No orders of the defined types were placed in this encounter.   Disposition:  Follow up  one year  Signed, Ovida Delagarza Fidel Levy, MD, 01/06/2023 2:45 PM    [facility] at [facility] [address] [place], [place] [address]

## 2023-01-12 ENCOUNTER — Encounter (INDEPENDENT_AMBULATORY_CARE_PROVIDER_SITE_OTHER): Payer: Self-pay | Admitting: Gastroenterology

## 2023-01-27 ENCOUNTER — Other Ambulatory Visit (HOSPITAL_COMMUNITY): Payer: Self-pay | Admitting: Internal Medicine

## 2023-01-27 DIAGNOSIS — Z1231 Encounter for screening mammogram for malignant neoplasm of breast: Secondary | ICD-10-CM

## 2023-02-08 ENCOUNTER — Ambulatory Visit (HOSPITAL_COMMUNITY)
Admission: RE | Admit: 2023-02-08 | Discharge: 2023-02-08 | Disposition: A | Discharge status: Home / Self Care | Payer: Medicare Other | Source: Ambulatory Visit | Attending: Internal Medicine | Admitting: Internal Medicine

## 2023-02-08 ENCOUNTER — Encounter (HOSPITAL_COMMUNITY): Payer: Self-pay

## 2023-02-08 DIAGNOSIS — Z1231 Encounter for screening mammogram for malignant neoplasm of breast: Secondary | ICD-10-CM | POA: Insufficient documentation

## 2023-02-10 DIAGNOSIS — C44319 Basal cell carcinoma of skin of other parts of face: Secondary | ICD-10-CM | POA: Diagnosis not present

## 2023-02-24 ENCOUNTER — Other Ambulatory Visit (INDEPENDENT_AMBULATORY_CARE_PROVIDER_SITE_OTHER): Payer: Self-pay | Admitting: Gastroenterology

## 2023-02-24 DIAGNOSIS — K219 Gastro-esophageal reflux disease without esophagitis: Secondary | ICD-10-CM

## 2023-03-03 DIAGNOSIS — K080 Exfoliation of teeth due to systemic causes: Secondary | ICD-10-CM | POA: Diagnosis not present

## 2023-03-11 ENCOUNTER — Ambulatory Visit (INDEPENDENT_AMBULATORY_CARE_PROVIDER_SITE_OTHER): Payer: Medicare Other | Admitting: Gastroenterology

## 2023-03-16 ENCOUNTER — Ambulatory Visit (INDEPENDENT_AMBULATORY_CARE_PROVIDER_SITE_OTHER): Payer: Medicare Other | Admitting: Gastroenterology

## 2023-03-16 ENCOUNTER — Encounter (INDEPENDENT_AMBULATORY_CARE_PROVIDER_SITE_OTHER): Payer: Self-pay | Admitting: Gastroenterology

## 2023-03-16 VITALS — BP 152/82 | HR 76 | Temp 97.8°F | Ht 63.0 in | Wt 159.6 lb

## 2023-03-16 DIAGNOSIS — R140 Abdominal distension (gaseous): Secondary | ICD-10-CM

## 2023-03-16 DIAGNOSIS — K219 Gastro-esophageal reflux disease without esophagitis: Secondary | ICD-10-CM

## 2023-03-16 NOTE — Patient Instructions (Signed)
Please continue protonix 20mg  daily Be mindful that higher fiber foods can cause more gas, as well as intaking too much air when eating You can try over the counter gasx/simethicone to help, if you do not have any improvement, please let me know In regards to the quivering sensation, I do not feel this is related to your GI tract, if your cardiologist does not feel that this is coming from your heart, there may be some component of anxiety still. It would be important to discuss this with your PCP and discuss potential genetic testing to see if any anxiety medications would be a better fit for you over others (IDgenetix) is one of the tests they can do.   Follow up 1 year  It was a pleasure to see you today. I want to create trusting relationships with patients and provide genuine, compassionate, and quality care. I truly value your feedback! please be on the lookout for a survey regarding your visit with me today. I appreciate your input about our visit and your time in completing this!    Astoria Condon L. Alver Sorrow, MSN, APRN, AGNP-C Adult-Gerontology Nurse Practitioner [facility] at [facility]

## 2023-03-16 NOTE — Progress Notes (Addendum)
Referring Provider: Celene Squibb, MD Primary Care Physician:  Celene Squibb, MD Primary GI Physician: Jenetta Downer   Chief Complaint  Patient presents with   Gastroesophageal Reflux    Follow up on GERD. Takes protonix 20mg  daily and would like to discuss coming off of med. Feels a "quiver"  on left side off and on for about 3 years. Sometimes when eating she feels like she is going to pass out. This started about 3 years ago also. Has seen cardiology for this issue.    HPI:   Haley Morrow is a 67 y.o. female with past medical history of anxiety, hyperlipidemia, hypertension and GERD, who presents for follow up of GERD and atypical chest pain.   Patient presenting today for follow up of GERD.  Last seen march 2023, at that time had ran out of her medication in December 2022 and did not have more refills. presented some recurrent episodes of lightheadedness recently and has been concerned about this.  had been told this may be related to anxiety.  Patient reports that she will follow with cardiologist next week.   Notably, in the last week of February she had burning chest pain (slightly different form her typical reflux episodes) and itching in her upper chest, which was different from her usual symptoms. Due to this she went to ER 02/16/2022. At that time she underwent blood work-up that showed very mildly decreased potassium of 3.4 the rest of CMP and CBC within normal limits.  Also underwent an EKG and a chest x-ray which did not show any acute abnormalities.She was prescribed oral potassium and was discharged home with referral to cardiology. She states the symptoms resolved on their own. She denies having heartburn recently.  Recommended to restart protonix 20mg  daily, increase to 40mg  daily if heartburn recurs, f/u with cardiology.   Cardiology visit on 01/06/23, previous monitor event with multiple episodes of SVT, maintained on metoprolol 25mg  BID  Present:  She is having some  sensation of "quivering" in her LUQ/Left chest area. This occurs intermittently, not brought on by anything specific. She will note this sensation for a few seconds when it occurs. She also notes some continued lightheadedness occasionally, sometimes when she eats. She denies any episodes of syncope. She also notes that she belches a lot and has a lot of flatulence. She had a pain a few weeks ago near her Left breast, she states that she would be able to belch and the pain would improve. She denies any heartburn or acid regurgitation, no nausea or vomiting. Appetite is good. She denies any early satiety. She denies any pain in her abdomen.   She notes she mentioned symptoms to cardiology when she saw them in January. She has been told in the past that her symptoms were likely related to anxiety but she did not tolerate anxiolytics well. She has also been tested for MS in the past to rule this out as well.    Last EGD: 06/17/2020, normal esophagus, few erosions were found in the gastric antrum (pathology with reactive gastropathy negative for H. pylori), normal duodenum Last Colonoscopy: 06/17/2020, cecal polyp, diverticulosis, external hemorrhoids (pathology consistent with tubular adenoma).    Recommended to repeat in 7 years.   Past Medical History:  Diagnosis Date   Anxiety    GERD (gastroesophageal reflux disease) 09/24/2020   High cholesterol    Hypertension    Weight loss 09/24/2020    Past Surgical History:  Procedure Laterality Date  BIOPSY  06/17/2020   Procedure: BIOPSY;  Surgeon: Rogene Houston, MD;  [facility];  Service: Endoscopy;;   COLONOSCOPY N/A 06/17/2020   Procedure: COLONOSCOPY;  Surgeon: Rogene Houston, MD;  [facility];  Service: Endoscopy;  Laterality: N/A;  955   ESOPHAGOGASTRODUODENOSCOPY N/A 06/17/2020   Procedure: ESOPHAGOGASTRODUODENOSCOPY (EGD);  Surgeon: Rogene Houston, MD;  [facility];  Service: Endoscopy;  Laterality:  N/A;   POLYPECTOMY  06/17/2020   Procedure: POLYPECTOMY;  Surgeon: Rogene Houston, MD;  [facility];  Service: Endoscopy;;   SHOULDER EXAMINAITON UNDER ANESTHESIA W/ INJECTION Left    VAGINAL HYSTERECTOMY      Current Outpatient Medications  Medication Sig Dispense Refill   hydrOXYzine (ATARAX) 25 MG tablet Take 1 tablet (25 mg total) by mouth every 8 (eight) hours as needed for anxiety. (Patient taking differently: Take 6.25 mg by mouth every 8 (eight) hours as needed for anxiety. 1/4 tablet prn) 30 tablet 2   loratadine (CLARITIN) 10 MG tablet Take 10 mg by mouth daily.     metoprolol tartrate (LOPRESSOR) 25 MG tablet Take 1 tablet (25 mg total) by mouth 2 (two) times daily. 180 tablet 3   pantoprazole (PROTONIX) 20 MG tablet TAKE 1 TABLET(20 MG) BY MOUTH DAILY 90 tablet 3   Prenatal Vit-DSS-Fe Cbn-FA (PRENATAL AD PO) Take 1 tablet by mouth daily at 6 (six) AM.     rosuvastatin (CRESTOR) 10 MG tablet Take 10 mg by mouth daily.     No current facility-administered medications for this visit.    Allergies as of 03/16/2023   (No Known Allergies)    Family History  Problem Relation Age of Onset   CAD Mother    Cancer Mother    Diabetes Father    Stroke Father     Social History   Socioeconomic History   Marital status: Widowed    Spouse name: Not on file   Number of children: 3   Years of education: 76   Highest education level: Not on file  Occupational History   Not on file  Tobacco Use   Smoking status: Never    Passive exposure: Current   Smokeless tobacco: Never  Vaping Use   Vaping Use: Never used  Substance and Sexual Activity   Alcohol use: Never   Drug use: Never   Sexual activity: Not Currently  Other Topics Concern   Not on file  Social History Narrative   Lives alone   Right handed   Caffeine use: Tea/coffee daily ( 2 each morning), no soda   Social Determinants of Health   Financial Resource Strain: Not on file  Food Insecurity:  Not on file  Transportation Needs: Not on file  Physical Activity: Not on file  Stress: Not on file  Social Connections: Not on file   Review of systems General: negative for malaise, night sweats, fever, chills, weight loss Neck: Negative for lumps, goiter, pain and significant neck swelling Resp: Negative for cough, wheezing, dyspnea at rest CV: Negative for chest pain, leg swelling, orthopnea +Palpitations? +lightheadedness  GI: denies melena, hematochezia, nausea, vomiting, diarrhea, constipation, dysphagia, odyonophagia, early satiety or unintentional weight loss. +belching  MSK: Negative for joint pain or swelling, back pain, and muscle pain. Derm: Negative for itching or rash Psych: Denies depression, anxiety, memory loss, confusion. No homicidal or suicidal ideation.  Heme: Negative for prolonged bleeding, bruising easily, and swollen nodes. Endocrine: Negative for cold or heat intolerance,  polyuria, polydipsia and goiter. Neuro: negative for tremor, gait imbalance, syncope and seizures. The remainder of the review of systems is noncontributory.  Physical Exam: BP (!) 152/82 (BP Location: Right Arm, Patient Position: Sitting, Cuff Size: Large)   Pulse 76   Temp 97.8 F (36.6 C) (Oral)   Ht 5\' 3"  (1.6 m)   Wt 159 lb 9.6 oz (72.4 kg)   BMI 28.27 kg/m  General:   Alert and oriented. No distress noted. Pleasant and cooperative.  Head:  Normocephalic and atraumatic. Eyes:  Conjuctiva clear without scleral icterus. Mouth:  Oral mucosa pink and moist. Good dentition. No lesions. Heart: Normal rate and rhythm, s1 and s2 heart sounds present.  Lungs: Clear lung sounds in all lobes. Respirations equal and unlabored. Abdomen:  +BS, soft, non-tender and non-distended. No rebound or guarding. No HSM or masses noted. Derm: No palmar erythema or jaundice Msk:  Symmetrical without gross deformities. Normal posture. Extremities:  Without edema. Neurologic:  Alert and  oriented  x4 Psych:  Alert and cooperative. Normal mood and affect.  Invalid input(s): "6 MONTHS"   ASSESSMENT: Haley Morrow is a 67 y.o. female presenting today for follow up of GERD.  GERD: continued on protonix 20mg  daily, she denies heartburn or acid regurgitation, is having some belching and had an episode of pain in Left breast/chest area that was relieved by belching. She continues to note "quivering" feeling in LUQ/Left chest. She has seen cardiology recently and maintained on metoprolol for history of SVT. It is unclear at this time if the quivering sensation she feels is cardiac related or still perhaps related to some stress/anxiety. I do not feel this is GI related. For her belching, she can try otc gas-x or simethicone and should let me know if this does not improve, will continue with PPI daily. I encouraged her to discuss anxiety with her PCP, as well as ID genetix testing as this may be a good option for her due to issues finding an anxiolytic in the past that she tolerated well.    PLAN:  Continue protonix 20mg  daily  2. Gas x/simethicone for excess gas  3. Discuss anxiety with PCP  4. Be mindful of high fiber/gas producing foods  All questions were answered, patient verbalized understanding and is in agreement with plan as outlined above.    Follow Up: 1 year   Megumi Treaster L. Alver Sorrow, MSN, APRN, AGNP-C Adult-Gerontology Nurse Practitioner [facility]  I have reviewed the note and agree with the APP's assessment as described in this progress note  [name], MD Gastroenterology and Hepatology [facility]

## 2023-04-29 ENCOUNTER — Other Ambulatory Visit: Payer: Self-pay | Admitting: Cardiology

## 2023-05-05 DIAGNOSIS — E782 Mixed hyperlipidemia: Secondary | ICD-10-CM | POA: Diagnosis not present

## 2023-05-05 DIAGNOSIS — I10 Essential (primary) hypertension: Secondary | ICD-10-CM | POA: Diagnosis not present

## 2023-05-05 DIAGNOSIS — R7301 Impaired fasting glucose: Secondary | ICD-10-CM | POA: Diagnosis not present

## 2023-05-10 DIAGNOSIS — L814 Other melanin hyperpigmentation: Secondary | ICD-10-CM | POA: Diagnosis not present

## 2023-05-10 DIAGNOSIS — Z08 Encounter for follow-up examination after completed treatment for malignant neoplasm: Secondary | ICD-10-CM | POA: Diagnosis not present

## 2023-05-10 DIAGNOSIS — L821 Other seborrheic keratosis: Secondary | ICD-10-CM | POA: Diagnosis not present

## 2023-05-10 DIAGNOSIS — D225 Melanocytic nevi of trunk: Secondary | ICD-10-CM | POA: Diagnosis not present

## 2023-05-11 DIAGNOSIS — N189 Chronic kidney disease, unspecified: Secondary | ICD-10-CM | POA: Diagnosis not present

## 2023-05-11 DIAGNOSIS — I129 Hypertensive chronic kidney disease with stage 1 through stage 4 chronic kidney disease, or unspecified chronic kidney disease: Secondary | ICD-10-CM | POA: Diagnosis not present

## 2023-05-11 DIAGNOSIS — F329 Major depressive disorder, single episode, unspecified: Secondary | ICD-10-CM | POA: Diagnosis not present

## 2023-05-11 DIAGNOSIS — Z0000 Encounter for general adult medical examination without abnormal findings: Secondary | ICD-10-CM | POA: Diagnosis not present

## 2023-05-11 DIAGNOSIS — E782 Mixed hyperlipidemia: Secondary | ICD-10-CM | POA: Diagnosis not present

## 2023-09-15 DIAGNOSIS — K080 Exfoliation of teeth due to systemic causes: Secondary | ICD-10-CM | POA: Diagnosis not present

## 2023-09-20 DIAGNOSIS — K080 Exfoliation of teeth due to systemic causes: Secondary | ICD-10-CM | POA: Diagnosis not present

## 2023-10-26 ENCOUNTER — Other Ambulatory Visit: Payer: Self-pay | Admitting: Cardiology

## 2023-10-28 DIAGNOSIS — L814 Other melanin hyperpigmentation: Secondary | ICD-10-CM | POA: Diagnosis not present

## 2023-10-28 DIAGNOSIS — D225 Melanocytic nevi of trunk: Secondary | ICD-10-CM | POA: Diagnosis not present

## 2023-10-28 DIAGNOSIS — L821 Other seborrheic keratosis: Secondary | ICD-10-CM | POA: Diagnosis not present

## 2023-10-28 DIAGNOSIS — D2262 Melanocytic nevi of left upper limb, including shoulder: Secondary | ICD-10-CM | POA: Diagnosis not present

## 2023-10-28 DIAGNOSIS — L570 Actinic keratosis: Secondary | ICD-10-CM | POA: Diagnosis not present

## 2023-11-05 DIAGNOSIS — I129 Hypertensive chronic kidney disease with stage 1 through stage 4 chronic kidney disease, or unspecified chronic kidney disease: Secondary | ICD-10-CM | POA: Diagnosis not present

## 2023-11-05 DIAGNOSIS — R7301 Impaired fasting glucose: Secondary | ICD-10-CM | POA: Diagnosis not present

## 2023-11-05 DIAGNOSIS — E782 Mixed hyperlipidemia: Secondary | ICD-10-CM | POA: Diagnosis not present

## 2023-11-11 DIAGNOSIS — F329 Major depressive disorder, single episode, unspecified: Secondary | ICD-10-CM | POA: Diagnosis not present

## 2023-11-11 DIAGNOSIS — R008 Other abnormalities of heart beat: Secondary | ICD-10-CM | POA: Diagnosis not present

## 2023-11-11 DIAGNOSIS — I129 Hypertensive chronic kidney disease with stage 1 through stage 4 chronic kidney disease, or unspecified chronic kidney disease: Secondary | ICD-10-CM | POA: Diagnosis not present

## 2023-11-11 DIAGNOSIS — N189 Chronic kidney disease, unspecified: Secondary | ICD-10-CM | POA: Diagnosis not present

## 2023-11-11 DIAGNOSIS — E782 Mixed hyperlipidemia: Secondary | ICD-10-CM | POA: Diagnosis not present

## 2023-11-11 DIAGNOSIS — Z23 Encounter for immunization: Secondary | ICD-10-CM | POA: Diagnosis not present

## 2023-11-11 DIAGNOSIS — E875 Hyperkalemia: Secondary | ICD-10-CM | POA: Diagnosis not present

## 2024-01-13 ENCOUNTER — Ambulatory Visit: Payer: Medicare Other | Admitting: Internal Medicine

## 2024-01-18 ENCOUNTER — Ambulatory Visit (INDEPENDENT_AMBULATORY_CARE_PROVIDER_SITE_OTHER): Payer: Medicare Other | Admitting: Gastroenterology

## 2024-01-18 ENCOUNTER — Encounter (INDEPENDENT_AMBULATORY_CARE_PROVIDER_SITE_OTHER): Payer: Self-pay | Admitting: Gastroenterology

## 2024-01-18 ENCOUNTER — Ambulatory Visit (HOSPITAL_COMMUNITY)
Admission: RE | Admit: 2024-01-18 | Discharge: 2024-01-18 | Disposition: A | Discharge status: Home / Self Care | Payer: Medicare Other | Source: Ambulatory Visit | Attending: Gastroenterology | Admitting: Gastroenterology

## 2024-01-18 ENCOUNTER — Other Ambulatory Visit (INDEPENDENT_AMBULATORY_CARE_PROVIDER_SITE_OTHER): Payer: Self-pay | Admitting: Gastroenterology

## 2024-01-18 VITALS — BP 135/80 | HR 59 | Temp 97.9°F | Ht 63.0 in | Wt 160.4 lb

## 2024-01-18 DIAGNOSIS — Z8719 Personal history of other diseases of the digestive system: Secondary | ICD-10-CM | POA: Diagnosis not present

## 2024-01-18 DIAGNOSIS — N281 Cyst of kidney, acquired: Secondary | ICD-10-CM | POA: Diagnosis not present

## 2024-01-18 DIAGNOSIS — R1032 Left lower quadrant pain: Secondary | ICD-10-CM | POA: Diagnosis not present

## 2024-01-18 DIAGNOSIS — R195 Other fecal abnormalities: Secondary | ICD-10-CM | POA: Diagnosis not present

## 2024-01-18 DIAGNOSIS — K5732 Diverticulitis of large intestine without perforation or abscess without bleeding: Secondary | ICD-10-CM | POA: Diagnosis not present

## 2024-01-18 MED ORDER — CIPROFLOXACIN HCL 500 MG PO TABS: 500.0000 mg | ORAL_TABLET | Freq: Two times a day (BID) | ORAL | 0 refills | Status: AC

## 2024-01-18 MED ORDER — METRONIDAZOLE 500 MG PO TABS: 500.0000 mg | ORAL_TABLET | Freq: Two times a day (BID) | ORAL | 0 refills | Status: AC

## 2024-01-18 MED ORDER — IOHEXOL 300 MG/ML  SOLN
100.0000 mL | Freq: Once | INTRAMUSCULAR | Status: AC | PRN
  Administered 2024-01-18: 100 mL via INTRAVENOUS

## 2024-01-18 NOTE — Progress Notes (Signed)
Referring Provider: Benita Stabile, MD Primary Care Physician:  Benita Stabile, MD Primary GI Physician: Dr. Levon Hedger   Chief Complaint  Patient presents with   Abdominal Pain    Low mid abdominal pain. Started about 3 days ago. Pain is cramping feeling. Having some diarrhea. Pain is right before having to have BM or before passing gas.    HPI:   Haley Morrow is a 68 y.o. female with past medical history of anxiety, hyperlipidemia, hypertension and GERD   Patient presenting today for lower abdominal pain   Patient reports lower mid to left abdominal pain that started on Saturday evening. She reports she did eat some cashews last week that made her feel a bit nauseated but this resolved spontaneously, she also some cucumbers a few days leading up to her abdominal pain. Also ate some peanuts one day last week. She notes feeling that she was going to have diarrhea over the weekend but only passing smaller pieces of stool. Felt somewhat better on Sunday. Stools are looser but not watery. No rectal bleeding or melena. No fevers or chills. No nausea or vomiting. Denies any urinary symptoms. She reports some improvement in pain after she defecates. Pain is usually present when she has the urge to defecate but notes some soreness that is constant in the area. denies any new medications. Her grandson is home sick today with GI symptoms but she has not been around him in a few days. No previous history of diverticulitis.    Last EGD: 06/17/2020, normal esophagus, few erosions were found in the gastric antrum (pathology with reactive gastropathy negative for H. pylori), normal duodenum Last Colonoscopy: 06/17/2020, cecal polyp, diverticulosis, external hemorrhoids (pathology consistent with tubular adenoma).    Past Medical History:  Diagnosis Date   Anxiety    GERD (gastroesophageal reflux disease) 09/24/2020   High cholesterol    Hypertension    Weight loss 09/24/2020    Past Surgical History:   Procedure Laterality Date   BIOPSY  06/17/2020   Procedure: BIOPSY;  Surgeon: Malissa Hippo, MD;  [facility];  Service: Endoscopy;;   COLONOSCOPY N/A 06/17/2020   Procedure: COLONOSCOPY;  Surgeon: Malissa Hippo, MD;  [facility];  Service: Endoscopy;  Laterality: N/A;  955   ESOPHAGOGASTRODUODENOSCOPY N/A 06/17/2020   Procedure: ESOPHAGOGASTRODUODENOSCOPY (EGD);  Surgeon: Malissa Hippo, MD;  [facility];  Service: Endoscopy;  Laterality: N/A;   POLYPECTOMY  06/17/2020   Procedure: POLYPECTOMY;  Surgeon: Malissa Hippo, MD;  [facility];  Service: Endoscopy;;   SHOULDER EXAMINAITON UNDER ANESTHESIA W/ INJECTION Left    VAGINAL HYSTERECTOMY      Current Outpatient Medications  Medication Sig Dispense Refill   hydrOXYzine (ATARAX) 25 MG tablet Take 1 tablet (25 mg total) by mouth every 8 (eight) hours as needed for anxiety. (Patient taking differently: Take 6.25 mg by mouth every 8 (eight) hours as needed for anxiety. 1/4 tablet prn) 30 tablet 2   loratadine (CLARITIN) 10 MG tablet Take 10 mg by mouth daily.     metoprolol tartrate (LOPRESSOR) 25 MG tablet TAKE 1 TABLET(25 MG) BY MOUTH TWICE DAILY 180 tablet 1   pantoprazole (PROTONIX) 20 MG tablet TAKE 1 TABLET(20 MG) BY MOUTH DAILY 90 tablet 3   rosuvastatin (CRESTOR) 10 MG tablet Take 10 mg by mouth daily.     No current facility-administered medications for this visit.    Allergies as of 01/18/2024   (  No Known Allergies)    Family History  Problem Relation Age of Onset   CAD Mother    Cancer Mother    Diabetes Father    Stroke Father     Social History   Socioeconomic History   Marital status: Widowed    Spouse name: Not on file   Number of children: 3   Years of education: 73   Highest education level: Not on file  Occupational History   Not on file  Tobacco Use   Smoking status: Never    Passive exposure: Current   Smokeless tobacco: Never  Vaping Use    Vaping status: Never Used  Substance and Sexual Activity   Alcohol use: Never   Drug use: Never   Sexual activity: Not Currently  Other Topics Concern   Not on file  Social History Narrative   Lives alone   Right handed   Caffeine use: Tea/coffee daily ( 2 each morning), no soda   Social Drivers of Corporate investment banker Strain: Not on file  Food Insecurity: Not on file  Transportation Needs: Not on file  Physical Activity: Not on file  Stress: Not on file  Social Connections: Not on file    Review of systems General: negative for malaise, night sweats, fever, chills, weight loss Neck: Negative for lumps, goiter, pain and significant neck swelling Resp: Negative for cough, wheezing, dyspnea at rest CV: Negative for chest pain, leg swelling, palpitations, orthopnea GI: denies melena, hematochezia, nausea, vomiting, diarrhea, constipation, dysphagia, odyonophagia, early satiety or unintentional weight loss. +mid to left lower abdominal pain +looser stools  The remainder of the review of systems is noncontributory.  Physical Exam: BP 135/80   Pulse (!) 59   Temp 97.9 F (36.6 C) (Oral)   Ht 5\' 3"  (1.6 m)   Wt 160 lb 6.4 oz (72.8 kg)   BMI 28.41 kg/m  General:   Alert and oriented. No distress noted. Pleasant and cooperative.  Head:  Normocephalic and atraumatic. Eyes:  Conjuctiva clear without scleral icterus. Mouth:  Oral mucosa pink and moist. Good dentition. No lesions. Heart: Normal rate and rhythm, s1 and s2 heart sounds present.  Lungs: Clear lung sounds in all lobes. Respirations equal and unlabored. Abdomen:  +BS, soft, and non-distended. Mild TTP of mid to Left lower abdomen. No rebound or guarding. No HSM or masses noted. Neurologic:  Alert and  oriented x4 Psych:  Alert and cooperative. Normal mood and affect.  Invalid input(s): "6 MONTHS"   ASSESSMENT: [name] is a 68 y.o. female presenting today with new onset of lower abdominal pain and  loose stools  Mid to left lower abdominal pain and loose stools that began on Saturday without fevers, nausea, vomiting, rectal bleeding or melena.  Patient denies any history of known diverticulitis in the past though colonoscopy years ago with sigmoid diverticulosis.  She does note she ate cucumbers a few times and had cashews and peanuts last week leading up to the symptoms.  She has some mild tenderness on abdominal exam today.  She does feel symptoms are improving, therefore could be dealing with transient viral gastroenteritis, however presentation is somewhat convincing for possible diverticulitis therefore would be important to rule this out via CT imaging.  Given she has no fever or rectal bleeding, will hold off on antibiotics at this time and obtain stat CT, will need antibiotic therapy if diverticulitis is evident on CT.  Advise she should avoid NSAIDs and consume soft/bland  diet for now.   PLAN:  -avoid NSAIDs -bland/soft diet -CT A/P with contrast stat  All questions were answered, patient verbalized understanding and is in agreement with plan as outlined above.   Follow Up: 2 months   Mishti Swanton L. Jeanmarie Hubert, MSN, APRN, AGNP-C Adult-Gerontology Nurse Practitioner [facility]  I have reviewed the note and agree with the APP's assessment as described in this progress note  If changes consistent with diverticulitis, may need to have a follow up colonoscopy scheduled 6-8 weeks after treatment  Katrinka Blazing, MD Gastroenterology and Hepatology [facility]

## 2024-01-18 NOTE — Patient Instructions (Signed)
We will obtain a CT of your abdomen to rule out diverticulitis If you develop rectal bleeding or fever please let me know Avoid Please avoid NSAIDs (advil, aleve, naproxen, goody powder, ibuprofen) as these can be very hard on your GI tract, causing inflammation, ulcers and damage to the lining of your GI tract.  Do bland, soft diet for now  Follow up 2 months

## 2024-01-19 ENCOUNTER — Ambulatory Visit: Payer: Medicare Other | Attending: Internal Medicine | Admitting: Internal Medicine

## 2024-01-19 ENCOUNTER — Encounter: Payer: Self-pay | Admitting: Internal Medicine

## 2024-01-19 VITALS — BP 148/80 | HR 57 | Ht 63.0 in | Wt 161.0 lb

## 2024-01-19 DIAGNOSIS — I340 Nonrheumatic mitral (valve) insufficiency: Secondary | ICD-10-CM | POA: Diagnosis not present

## 2024-01-19 DIAGNOSIS — I10 Essential (primary) hypertension: Secondary | ICD-10-CM | POA: Diagnosis not present

## 2024-01-19 NOTE — Progress Notes (Signed)
Cardiology Office Note  Date: 01/19/2024   ID: [name], [name] Jan 19, 1956, MRN [id number]  PCP:  Benita Stabile, MD  Cardiologist:  Marjo Bicker, MD Electrophysiologist:  None     History of Present Illness: Haley Morrow is a 68 y.o. female known to have HTN, HLD, SVT is here for follow-up visit.  Patient lost her husband 3 years ago after which she started to have palpitations.  She was eventually referred to EP clinic in 02/2022 for evaluation of palpitations. She underwent event monitor which showed 756 episodes of SVT, symptomatic for which she was started on metoprolol tartrate 25 mg twice daily with significant improvement in her symptoms.  She was later switched to metoprolol succinate 50 mg once daily but she continued to have dizziness and hence was switched back to metoprolol tartrate 25 mg twice daily.  She was also diagnosed with HTN in the past but was not on any medications due to orthostatic hypotension and dizziness concerns. She presented today for follow-up visit.    Patient is here for follow-up visit with me. No interval ER visits or hospitalizations. Patient denied any rest or exertional chest discomfort, tightness, heaviness or pressure, rest or exertional dyspnea, palpitations, light-headedness, syncope and LE swelling. Compliant with medications and no side-effects. No bleeding complications. Denied smoking cigarettes.   Past Medical History:  Diagnosis Date   Anxiety    GERD (gastroesophageal reflux disease) 09/24/2020   High cholesterol    Hypertension    Weight loss 09/24/2020    Past Surgical History:  Procedure Laterality Date   BIOPSY  06/17/2020   Procedure: BIOPSY;  Surgeon: Malissa Hippo, MD;  [facility];  Service: Endoscopy;;   COLONOSCOPY N/A 06/17/2020   Procedure: COLONOSCOPY;  Surgeon: Malissa Hippo, MD;  [facility];  Service: Endoscopy;  Laterality: N/A;  955   ESOPHAGOGASTRODUODENOSCOPY N/A  06/17/2020   Procedure: ESOPHAGOGASTRODUODENOSCOPY (EGD);  Surgeon: Malissa Hippo, MD;  [facility];  Service: Endoscopy;  Laterality: N/A;   POLYPECTOMY  06/17/2020   Procedure: POLYPECTOMY;  Surgeon: Malissa Hippo, MD;  [facility];  Service: Endoscopy;;   SHOULDER EXAMINAITON UNDER ANESTHESIA W/ INJECTION Left    VAGINAL HYSTERECTOMY      Current Outpatient Medications  Medication Sig Dispense Refill   ciprofloxacin (CIPRO) 500 MG tablet Take 1 tablet (500 mg total) by mouth 2 (two) times daily for 10 days. 20 tablet 0   metroNIDAZOLE (FLAGYL) 500 MG tablet Take 1 tablet (500 mg total) by mouth 2 (two) times daily for 10 days. 20 tablet 0   hydrOXYzine (ATARAX) 25 MG tablet Take 1 tablet (25 mg total) by mouth every 8 (eight) hours as needed for anxiety. (Patient taking differently: Take 6.25 mg by mouth every 8 (eight) hours as needed for anxiety. 1/4 tablet prn) 30 tablet 2   loratadine (CLARITIN) 10 MG tablet Take 10 mg by mouth daily.     metoprolol tartrate (LOPRESSOR) 25 MG tablet TAKE 1 TABLET(25 MG) BY MOUTH TWICE DAILY 180 tablet 1   pantoprazole (PROTONIX) 20 MG tablet TAKE 1 TABLET(20 MG) BY MOUTH DAILY 90 tablet 3   rosuvastatin (CRESTOR) 10 MG tablet Take 10 mg by mouth daily.     No current facility-administered medications for this visit.   Allergies:  Patient has no known allergies.   Social History: The patient  reports that she has never smoked. She has been exposed to tobacco smoke.  She has never used smokeless tobacco. She reports that she does not drink alcohol and does not use drugs.   Family History: The patient's family history includes CAD in her mother; Cancer in her mother; Diabetes in her father; Stroke in her father.   ROS:  Please see the history of present illness. Otherwise, complete review of systems is positive for none.  All other systems are reviewed and negative.   Physical Exam: VS:  There were no vitals taken for  this visit., BMI There is no height or weight on file to calculate BMI.  Wt Readings from Last 3 Encounters:  01/18/24 160 lb 6.4 oz (72.8 kg)  03/16/23 159 lb 9.6 oz (72.4 kg)  01/06/23 157 lb 6.4 oz (71.4 kg)    General: Patient appears comfortable at rest. HEENT: Conjunctiva and lids normal, oropharynx clear with moist mucosa. Neck: Supple, no elevated JVP or carotid bruits, no thyromegaly. Lungs: Clear to auscultation, nonlabored breathing at rest. Cardiac: Regular rate and rhythm, no S3 or significant systolic murmur, no pericardial rub. Abdomen: Soft, nontender, no hepatomegaly, bowel sounds present, no guarding or rebound. Extremities: No pitting edema, distal pulses 2+. Skin: Warm and dry. Musculoskeletal: No kyphosis. Neuropsychiatric: Alert and oriented x3, affect grossly appropriate.  ECG:  An ECG dated 01/06/2023 was personally reviewed today and demonstrated:  Normal sinus rhythm and no ST-T changes  Recent Labwork: No results found for requested labs within last 365 days.     Component Value Date/Time   LDLDIRECT 93 03/12/2022 1602    Other Studies Reviewed Today: Echo in 03/2022 LVEF normal Pseudo normal diastolic pattern Mild to moderate MR  Assessment and Plan:  SVT: No interval ER visits or hospitalizations for SVT.  Asymptomatic.  Continue metoprolol tartrate 25 mg twice daily.  Valvular heart disease, mild to moderate MR in 2023: Asymptomatic, will repeat echocardiogram.  If stable, can forego repeat imaging until symptomatic.  HLD, at goal: Continue rosuvastatin 10 mg nightly, goal LDL less than 100.    Medication Adjustments/Labs and Tests Ordered: Current medicines are reviewed at length with the patient today.  Concerns regarding medicines are outlined above.   Tests Ordered: Orders Placed This Encounter  Procedures   EKG 12-Lead    Medication Changes: No orders of the defined types were placed in this encounter.   Disposition:  Follow up   one year  Signed, Raynell Scott Verne Spurr, MD, 01/19/2024 10:09 AM    [facility] [address] [place], [place] [address]

## 2024-01-19 NOTE — Patient Instructions (Signed)
Medication Instructions:  Your physician recommends that you continue on your current medications as directed. Please refer to the Current Medication list given to you today.  *If you need a refill on your cardiac medications before your next appointment, please call your pharmacy*   Lab Work: NONE   If you have labs (blood work) drawn today and your tests are completely normal, you will receive your results only by: MyChart Message (if you have MyChart) OR A paper copy in the mail If you have any lab test that is abnormal or we need to change your treatment, we will call you to review the results.   Testing/Procedures: Your physician has requested that you have an echocardiogram. Echocardiography is a painless test that uses sound waves to create images of your heart. It provides your doctor with information about the size and shape of your heart and how well your heart's chambers and valves are working. This procedure takes approximately one hour. There are no restrictions for this procedure. Please do NOT wear cologne, perfume, aftershave, or lotions (deodorant is allowed). Please arrive 15 minutes prior to your appointment time.  Please note: We ask at that you not bring children with you during ultrasound (echo/ vascular) testing. Due to room size and safety concerns, children are not allowed in the ultrasound rooms during exams. Our front office staff cannot provide observation of children in our lobby area while testing is being conducted. An adult accompanying a patient to their appointment will only be allowed in the ultrasound room at the discretion of the ultrasound technician under special circumstances. We apologize for any inconvenience.    Follow-Up: At [facility], you and your health needs are our priority.  As part of our continuing mission to provide you with exceptional heart care, we have created designated Provider Care Teams.  These Care Teams include your  primary Cardiologist (physician) and Advanced Practice Providers (APPs -  Physician Assistants and Nurse Practitioners) who all work together to provide you with the care you need, when you need it.  We recommend signing up for the patient portal called "MyChart".  Sign up information is provided on this After Visit Summary.  MyChart is used to connect with patients for Virtual Visits (Telemedicine).  Patients are able to view lab/test results, encounter notes, upcoming appointments, etc.  Non-urgent messages can be sent to your provider as well.   To learn more about what you can do with MyChart, go to [email or web address].    Your next appointment:   1 year(s)  Provider:   You may see Vishnu P Mallipeddi, MD or one of the following Advanced Practice Providers on your designated Care Team:   Randall An, PA-C  Jacolyn Reedy, PA-C     Other Instructions Thank you for choosing  [facility]!

## 2024-01-27 ENCOUNTER — Other Ambulatory Visit: Payer: Self-pay | Admitting: Internal Medicine

## 2024-02-02 ENCOUNTER — Ambulatory Visit (HOSPITAL_COMMUNITY): Payer: Medicare Other

## 2024-02-11 ENCOUNTER — Ambulatory Visit (HOSPITAL_COMMUNITY): Payer: Medicare Other

## 2024-02-16 DIAGNOSIS — R059 Cough, unspecified: Secondary | ICD-10-CM | POA: Diagnosis not present

## 2024-02-16 DIAGNOSIS — Z20828 Contact with and (suspected) exposure to other viral communicable diseases: Secondary | ICD-10-CM | POA: Diagnosis not present

## 2024-02-16 DIAGNOSIS — R0981 Nasal congestion: Secondary | ICD-10-CM | POA: Diagnosis not present

## 2024-02-23 ENCOUNTER — Ambulatory Visit (HOSPITAL_COMMUNITY)
Admission: RE | Admit: 2024-02-23 | Discharge: 2024-02-23 | Disposition: A | Discharge status: Home / Self Care | Payer: Medicare Other | Source: Ambulatory Visit | Attending: Internal Medicine | Admitting: Internal Medicine

## 2024-02-23 DIAGNOSIS — I340 Nonrheumatic mitral (valve) insufficiency: Secondary | ICD-10-CM | POA: Diagnosis not present

## 2024-02-23 LAB — ECHOCARDIOGRAM COMPLETE
Area-P 1/2: 2.99 cm2
S' Lateral: 2.2 cm

## 2024-02-23 NOTE — Progress Notes (Signed)
*  PRELIMINARY RESULTS* Echocardiogram 2D Echocardiogram has been performed.  Stacey Drain 02/23/2024, 12:42 PM

## 2024-02-25 ENCOUNTER — Other Ambulatory Visit (INDEPENDENT_AMBULATORY_CARE_PROVIDER_SITE_OTHER): Payer: Self-pay | Admitting: Gastroenterology

## 2024-02-25 DIAGNOSIS — K219 Gastro-esophageal reflux disease without esophagitis: Secondary | ICD-10-CM

## 2024-03-16 ENCOUNTER — Ambulatory Visit (INDEPENDENT_AMBULATORY_CARE_PROVIDER_SITE_OTHER): Payer: Medicare Other | Admitting: Gastroenterology

## 2024-03-20 ENCOUNTER — Ambulatory Visit (INDEPENDENT_AMBULATORY_CARE_PROVIDER_SITE_OTHER): Payer: Medicare Other | Admitting: Gastroenterology

## 2024-03-20 VITALS — BP 148/83 | HR 69 | Temp 97.1°F | Ht 63.0 in | Wt 157.7 lb

## 2024-03-20 DIAGNOSIS — Z8719 Personal history of other diseases of the digestive system: Secondary | ICD-10-CM | POA: Diagnosis not present

## 2024-03-20 DIAGNOSIS — K219 Gastro-esophageal reflux disease without esophagitis: Secondary | ICD-10-CM

## 2024-03-20 DIAGNOSIS — K5732 Diverticulitis of large intestine without perforation or abscess without bleeding: Secondary | ICD-10-CM | POA: Insufficient documentation

## 2024-03-20 NOTE — Patient Instructions (Addendum)
 Stop pantoprazole for now, may restart if presenting recurrent heartburn, chest pain or abdominal pain Can read about TIF if presenting recurrent symptoms of GERD and interested in not taking medications to control GERD Will recommend proceeding with colonoscopy after most recent episode of diverticulitis, please give [place] a call if interested in scheduling a colonoscopy

## 2024-03-20 NOTE — Progress Notes (Signed)
 Haley Morrow, M.D. Gastroenterology & Hepatology [facility] Gastroenterology [address] [place], [place] [address]  Primary Care Physician: Haley Stabile, MD [address] Haley [place] [address]  I will communicate my assessment and recommendations to the referring MD via EMR.  Problems: GERD History of diverticulitis.  History of Present Illness: Haley Morrow is a 68 y.o. female with past medical history of anxiety, hyperlipidemia, hypertension and GERD who presents for follow up of GERD and diverticulitis.  The patient was last seen on 01/18/2024. At that time, the patient was ordered to have a CT abdomen and pelvis with contrast stat for evaluation of worsening abdominal pain.  She was also advised to avoid NSAIDs.  CT abdomen pelvis with IV contrast performed on 01/18/2024 which showed focal sigmoid diverticulitis.  She was given a 10-day course of Flagyl and ciprofloxacin by our office.  The patient currently reports feeling well and no complaints States symptoms resolved with antibiotics. The patient denies having any heartburn, dysphagia, nausea, vomiting, fever, chills, hematochezia, melena, hematemesis, abdominal distention, abdominal pain, diarrhea, jaundice, pruritus or weight loss.  She takes pantoprazole 20 mg qday. She has been taking this since 2021, was initially started on this after being found to have gastritis and burping. She would like to discuss if she can come off her pantoprazole.  Last EGD: 06/17/2020, normal esophagus, few erosions were found in the gastric antrum (pathology with reactive gastropathy negative for H. pylori), normal duodenum Last Colonoscopy: 06/17/2020, cecal polyp, diverticulosis, external hemorrhoids (pathology consistent with tubular adenoma).   Past Medical History: Past Medical History:  Diagnosis Date   Anxiety    GERD (gastroesophageal reflux disease) 09/24/2020   High cholesterol    Hypertension     Weight loss 09/24/2020    Past Surgical History: Past Surgical History:  Procedure Laterality Date   BIOPSY  06/17/2020   Procedure: BIOPSY;  Surgeon: Haley Hippo, MD;  [facility];  Service: Endoscopy;;   COLONOSCOPY N/A 06/17/2020   Procedure: COLONOSCOPY;  Surgeon: Haley Hippo, MD;  [facility];  Service: Endoscopy;  Laterality: N/A;  955   ESOPHAGOGASTRODUODENOSCOPY N/A 06/17/2020   Procedure: ESOPHAGOGASTRODUODENOSCOPY (EGD);  Surgeon: Haley Hippo, MD;  [facility];  Service: Endoscopy;  Laterality: N/A;   POLYPECTOMY  06/17/2020   Procedure: POLYPECTOMY;  Surgeon: Haley Hippo, MD;  [facility];  Service: Endoscopy;;   SHOULDER EXAMINAITON UNDER ANESTHESIA W/ INJECTION Left    VAGINAL HYSTERECTOMY      Family History: Family History  Problem Relation Age of Onset   CAD Mother    Cancer Mother    Diabetes Father    Stroke Father     Social History: Social History   Tobacco Use  Smoking Status Never   Passive exposure: Current  Smokeless Tobacco Never   Social History   Substance and Sexual Activity  Alcohol Use Never   Social History   Substance and Sexual Activity  Drug Use Never    Allergies: No Known Allergies  Medications: Current Outpatient Medications  Medication Sig Dispense Refill   hydrOXYzine (ATARAX) 25 MG tablet Take 1 tablet (25 mg total) by mouth every 8 (eight) hours as needed for anxiety. (Patient taking differently: Take 6.25 mg by mouth every 8 (eight) hours as needed for anxiety. 1/4 tablet prn) 30 tablet 2   loratadine (CLARITIN) 10 MG tablet Take 10 mg by mouth daily.  metoprolol tartrate (LOPRESSOR) 25 MG tablet TAKE 1 TABLET BY MOUTH TWICE DAILY 180 tablet 1   pantoprazole (PROTONIX) 20 MG tablet TAKE 1 TABLET(20 MG) BY MOUTH DAILY 90 tablet 0   rosuvastatin (CRESTOR) 10 MG tablet Take 10 mg by mouth daily.     No current facility-administered medications for  this visit.    Review of Systems: GENERAL: negative for malaise, night sweats HEENT: No changes in hearing or vision, no nose bleeds or other nasal problems. NECK: Negative for lumps, goiter, pain and significant neck swelling RESPIRATORY: Negative for cough, wheezing CARDIOVASCULAR: Negative for chest pain, leg swelling, palpitations, orthopnea GI: SEE HPI MUSCULOSKELETAL: Negative for joint pain or swelling, back pain, and muscle pain. SKIN: Negative for lesions, rash PSYCH: Negative for sleep disturbance, mood disorder and recent psychosocial stressors. HEMATOLOGY Negative for prolonged bleeding, bruising easily, and swollen nodes. ENDOCRINE: Negative for cold or heat intolerance, polyuria, polydipsia and goiter. NEURO: negative for tremor, gait imbalance, syncope and seizures. The remainder of the review of systems is noncontributory.   Physical Exam: BP (!) 148/83   Pulse 69   Temp (!) 97.1 F (36.2 C)   Ht 5\' 3"  (1.6 m)   Wt 157 lb 11.2 oz (71.5 kg)   BMI 27.94 kg/m  GENERAL: The patient is AO x3, in no acute distress. HEENT: Head is normocephalic and atraumatic. EOMI are intact. Mouth is well hydrated and without lesions. NECK: Supple. No masses LUNGS: Clear to auscultation. No presence of rhonchi/wheezing/rales. Adequate chest expansion HEART: RRR, normal s1 and s2. ABDOMEN: Soft, nontender, no guarding, no peritoneal signs, and nondistended. BS +. No masses. EXTREMITIES: Without any cyanosis, clubbing, rash, lesions or edema. NEUROLOGIC: AOx3, no focal motor deficit. SKIN: no jaundice, no rashes  Imaging/Labs: as above  I personally reviewed and interpreted the available labs, imaging and endoscopic files.  Impression and Plan: Haley Morrow is a 68 y.o. female with past medical history of anxiety, hyperlipidemia, hypertension and GERD who presents for follow up of GERD and diverticulitis.  Patient has presented adequate control of GERD and has not presented  any more episodes of epigastric pain, which was considered to be secondary to gastritis versus functional in nature per Dr. Karilyn Cota.  She is currently asymptomatic at the moment and would like to try to come off PPI.  I think this is reasonable for now.  If she were to have recurrent symptoms she will need to go back on low-dose pantoprazole 20 mg daily.  If the patient has recurrent heartburn and would like to come off PPI indefinitely, she can consider undergoing TIF.  Pamphlet was provided to the patient today.  Notably, she had had 1 episode of diverticulitis.  I encouraged her to proceed with a colonoscopy to rule out malignancy as a cause for the episode of diverticulitis but she would like to think about this.  She will let us know if decided to proceed with colonoscopy.  -Stop pantoprazole for now, may restart if presenting recurrent heartburn, chest pain or abdominal pain -Can read about TIF if presenting recurrent symptoms of GERD and interested in not taking medications to control GERD -Will recommend proceeding with colonoscopy after most recent episode of diverticulitis, patient should give [place] a call if interested in scheduling a colonoscopy  All questions were answered.      Haley Blazing, MD Gastroenterology and Hepatology [facility] Gastroenterology

## 2024-04-27 DIAGNOSIS — L2989 Other pruritus: Secondary | ICD-10-CM | POA: Diagnosis not present

## 2024-04-27 DIAGNOSIS — L821 Other seborrheic keratosis: Secondary | ICD-10-CM | POA: Diagnosis not present

## 2024-04-27 DIAGNOSIS — L820 Inflamed seborrheic keratosis: Secondary | ICD-10-CM | POA: Diagnosis not present

## 2024-04-27 DIAGNOSIS — L538 Other specified erythematous conditions: Secondary | ICD-10-CM | POA: Diagnosis not present

## 2024-04-27 DIAGNOSIS — D225 Melanocytic nevi of trunk: Secondary | ICD-10-CM | POA: Diagnosis not present

## 2024-04-27 DIAGNOSIS — Z789 Other specified health status: Secondary | ICD-10-CM | POA: Diagnosis not present

## 2024-04-27 DIAGNOSIS — L814 Other melanin hyperpigmentation: Secondary | ICD-10-CM | POA: Diagnosis not present

## 2024-04-27 DIAGNOSIS — L590 Erythema ab igne [dermatitis ab igne]: Secondary | ICD-10-CM | POA: Diagnosis not present

## 2024-05-03 DIAGNOSIS — E1165 Type 2 diabetes mellitus with hyperglycemia: Secondary | ICD-10-CM | POA: Diagnosis not present

## 2024-05-03 DIAGNOSIS — I10 Essential (primary) hypertension: Secondary | ICD-10-CM | POA: Diagnosis not present

## 2024-05-10 DIAGNOSIS — Z0000 Encounter for general adult medical examination without abnormal findings: Secondary | ICD-10-CM | POA: Diagnosis not present

## 2024-05-10 DIAGNOSIS — R7301 Impaired fasting glucose: Secondary | ICD-10-CM | POA: Diagnosis not present

## 2024-05-10 DIAGNOSIS — I129 Hypertensive chronic kidney disease with stage 1 through stage 4 chronic kidney disease, or unspecified chronic kidney disease: Secondary | ICD-10-CM | POA: Diagnosis not present

## 2024-05-10 DIAGNOSIS — E782 Mixed hyperlipidemia: Secondary | ICD-10-CM | POA: Diagnosis not present

## 2024-05-10 DIAGNOSIS — N189 Chronic kidney disease, unspecified: Secondary | ICD-10-CM | POA: Diagnosis not present

## 2024-06-14 DIAGNOSIS — H524 Presbyopia: Secondary | ICD-10-CM | POA: Diagnosis not present

## 2024-06-29 ENCOUNTER — Other Ambulatory Visit (HOSPITAL_COMMUNITY): Payer: Self-pay | Admitting: Nurse Practitioner

## 2024-06-29 DIAGNOSIS — Z1231 Encounter for screening mammogram for malignant neoplasm of breast: Secondary | ICD-10-CM

## 2024-06-29 DIAGNOSIS — Z13820 Encounter for screening for osteoporosis: Secondary | ICD-10-CM

## 2024-07-07 ENCOUNTER — Ambulatory Visit (HOSPITAL_COMMUNITY)
Admission: RE | Admit: 2024-07-07 | Discharge: 2024-07-07 | Disposition: A | Discharge status: Home / Self Care | Source: Ambulatory Visit | Attending: Nurse Practitioner | Admitting: Nurse Practitioner

## 2024-07-07 DIAGNOSIS — M8589 Other specified disorders of bone density and structure, multiple sites: Secondary | ICD-10-CM | POA: Insufficient documentation

## 2024-07-07 DIAGNOSIS — Z13820 Encounter for screening for osteoporosis: Secondary | ICD-10-CM | POA: Insufficient documentation

## 2024-07-07 DIAGNOSIS — Z780 Asymptomatic menopausal state: Secondary | ICD-10-CM | POA: Diagnosis not present

## 2024-07-07 DIAGNOSIS — Z1231 Encounter for screening mammogram for malignant neoplasm of breast: Secondary | ICD-10-CM | POA: Diagnosis not present

## 2024-08-02 ENCOUNTER — Other Ambulatory Visit: Payer: Self-pay | Admitting: Internal Medicine

## 2024-10-25 ENCOUNTER — Encounter (INDEPENDENT_AMBULATORY_CARE_PROVIDER_SITE_OTHER): Payer: Self-pay | Admitting: Gastroenterology

## 2024-11-07 DIAGNOSIS — D1801 Hemangioma of skin and subcutaneous tissue: Secondary | ICD-10-CM | POA: Diagnosis not present

## 2024-11-07 DIAGNOSIS — L814 Other melanin hyperpigmentation: Secondary | ICD-10-CM | POA: Diagnosis not present

## 2024-11-07 DIAGNOSIS — L821 Other seborrheic keratosis: Secondary | ICD-10-CM | POA: Diagnosis not present

## 2024-11-07 DIAGNOSIS — L590 Erythema ab igne [dermatitis ab igne]: Secondary | ICD-10-CM | POA: Diagnosis not present

## 2024-11-09 DIAGNOSIS — Z23 Encounter for immunization: Secondary | ICD-10-CM | POA: Diagnosis not present

## 2024-11-09 DIAGNOSIS — Z0000 Encounter for general adult medical examination without abnormal findings: Secondary | ICD-10-CM | POA: Diagnosis not present

## 2024-11-09 DIAGNOSIS — R7301 Impaired fasting glucose: Secondary | ICD-10-CM | POA: Diagnosis not present

## 2024-11-09 DIAGNOSIS — I129 Hypertensive chronic kidney disease with stage 1 through stage 4 chronic kidney disease, or unspecified chronic kidney disease: Secondary | ICD-10-CM | POA: Diagnosis not present

## 2024-11-09 DIAGNOSIS — N189 Chronic kidney disease, unspecified: Secondary | ICD-10-CM | POA: Diagnosis not present

## 2024-11-09 DIAGNOSIS — E782 Mixed hyperlipidemia: Secondary | ICD-10-CM | POA: Diagnosis not present

## 2024-11-20 ENCOUNTER — Inpatient Hospital Stay: Attending: Family | Admitting: Family

## 2024-11-20 ENCOUNTER — Other Ambulatory Visit: Payer: Self-pay | Admitting: *Deleted

## 2024-11-20 ENCOUNTER — Inpatient Hospital Stay

## 2024-11-20 ENCOUNTER — Encounter: Payer: Self-pay | Admitting: Family

## 2024-11-20 VITALS — BP 145/72 | HR 63 | Temp 97.8°F | Resp 16 | Ht 63.0 in | Wt 154.8 lb

## 2024-11-20 DIAGNOSIS — Z85828 Personal history of other malignant neoplasm of skin: Secondary | ICD-10-CM | POA: Diagnosis not present

## 2024-11-20 DIAGNOSIS — Z79899 Other long term (current) drug therapy: Secondary | ICD-10-CM | POA: Diagnosis not present

## 2024-11-20 DIAGNOSIS — Z90710 Acquired absence of both cervix and uterus: Secondary | ICD-10-CM | POA: Diagnosis not present

## 2024-11-20 DIAGNOSIS — F419 Anxiety disorder, unspecified: Secondary | ICD-10-CM | POA: Insufficient documentation

## 2024-11-20 DIAGNOSIS — K3189 Other diseases of stomach and duodenum: Secondary | ICD-10-CM | POA: Diagnosis not present

## 2024-11-20 DIAGNOSIS — I10 Essential (primary) hypertension: Secondary | ICD-10-CM | POA: Insufficient documentation

## 2024-11-20 DIAGNOSIS — E7800 Pure hypercholesterolemia, unspecified: Secondary | ICD-10-CM | POA: Insufficient documentation

## 2024-11-20 DIAGNOSIS — Z803 Family history of malignant neoplasm of breast: Secondary | ICD-10-CM | POA: Insufficient documentation

## 2024-11-20 DIAGNOSIS — D751 Secondary polycythemia: Secondary | ICD-10-CM | POA: Insufficient documentation

## 2024-11-20 LAB — CBC WITH DIFFERENTIAL/PLATELET
Abs Immature Granulocytes: 0.02 K/uL (ref 0.00–0.07)
Basophils Absolute: 0 K/uL (ref 0.0–0.1)
Basophils Relative: 1 %
Eosinophils Absolute: 0 K/uL (ref 0.0–0.5)
Eosinophils Relative: 0 %
HCT: 47.7 % — ABNORMAL HIGH (ref 36.0–46.0)
Hemoglobin: 16.4 g/dL — ABNORMAL HIGH (ref 12.0–15.0)
Immature Granulocytes: 0 %
Lymphocytes Relative: 17 %
Lymphs Abs: 1.1 K/uL (ref 0.7–4.0)
MCH: 32 pg (ref 26.0–34.0)
MCHC: 34.4 g/dL (ref 30.0–36.0)
MCV: 93.2 fL (ref 80.0–100.0)
Monocytes Absolute: 0.4 K/uL (ref 0.1–1.0)
Monocytes Relative: 6 %
Neutro Abs: 4.8 K/uL (ref 1.7–7.7)
Neutrophils Relative %: 76 %
Platelets: 242 K/uL (ref 150–400)
RBC: 5.12 MIL/uL — ABNORMAL HIGH (ref 3.87–5.11)
RDW: 12.3 % (ref 11.5–15.5)
WBC: 6.3 K/uL (ref 4.0–10.5)
nRBC: 0 % (ref 0.0–0.2)

## 2024-11-20 LAB — COMPREHENSIVE METABOLIC PANEL WITH GFR
ALT: 12 U/L (ref 0–44)
AST: 19 U/L (ref 15–41)
Albumin: 4.5 g/dL (ref 3.5–5.0)
Alkaline Phosphatase: 80 U/L (ref 38–126)
Anion gap: 12 (ref 5–15)
BUN: 13 mg/dL (ref 8–23)
CO2: 26 mmol/L (ref 22–32)
Calcium: 9.9 mg/dL (ref 8.9–10.3)
Chloride: 105 mmol/L (ref 98–111)
Creatinine, Ser: 0.74 mg/dL (ref 0.44–1.00)
GFR, Estimated: >60 mL/min (ref >60)
Glucose, Bld: 95 mg/dL (ref 70–99)
Potassium: 5.2 mmol/L — ABNORMAL HIGH (ref 3.5–5.1)
Sodium: 143 mmol/L (ref 135–145)
Total Bilirubin: 0.5 mg/dL (ref 0.0–1.2)
Total Protein: 7.4 g/dL (ref 6.5–8.1)

## 2024-11-20 LAB — IRON AND TIBC
Iron: 92 ug/dL (ref 28–170)
Saturation Ratios: 27 % (ref 10.4–31.8)
TIBC: 339 ug/dL (ref 250–450)
UIBC: 247 ug/dL

## 2024-11-20 LAB — RETICULOCYTES
Immature Retic Fract: 4.3 % (ref 2.3–15.9)
RBC.: 5.14 MIL/uL — ABNORMAL HIGH (ref 3.87–5.11)
Retic Count, Absolute: 64.8 K/uL (ref 19.0–186.0)
Retic Ct Pct: 1.3 % (ref 0.4–3.1)

## 2024-11-20 LAB — TECHNOLOGIST SMEAR REVIEW: Plt Morphology: NORMAL

## 2024-11-20 LAB — FERRITIN: Ferritin: 175 ng/mL (ref 11–307)

## 2024-11-20 LAB — LACTATE DEHYDROGENASE: LDH: 197 U/L (ref 105–235)

## 2024-11-20 NOTE — Progress Notes (Addendum)
 Hematology/Oncology Consultation   Name: Haley Morrow      MRN: [id number]    Location: Room/bed info not found  Date: 11/20/2024 Time:11:46 AM   REFERRING PHYSICIAN:  Norleen Hurst, MD  REASON FOR CONSULT:  Secondary polycythemia    DIAGNOSIS: Mild erythrocytisis  HISTORY OF PRESENT ILLNESS: Haley Morrow is present at [facility] for our virtual visit today. 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  Her only complaint at this time is chapped/dry lips.  She has an appointment for sleep study to see if sleep apnea is the driving factor.  No hormone replacement therapy use.  No smoking, ETOH or recreational drug use.  No known familial history of erythrocytosis.  She is not currently taking an aspirin daily.  No ruddy complexion.  No itching with heat exposure.  No personal history of stroke, heart attack or thrombotic event.  Her father and half sister both had history of stroke.  She has occasional palpitations with history of SVT. She takes Lopressor  daily to prevent.  No history of thyroid  disease or diabetes.  She had a partial vaginal hysterectomy at the age of 33 due to heavy cycle and pain.  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  Her mother had history of stomach cancer, father had penile cancer and half sister had breast cancer.  Mammogram this past July was negative.  She had an EGD and colonoscopy in June 2021. EGD showed erosive gastropathy without bleeding. Colonoscopy revealed diverticulosis in the sigmoid colon and external hemorrhoids. She also had 1 benign polyp removed from the cecum.  She had a bout of diverticulitis back in February with abdominal pain and bloating. She last saw GI the following month in March.  No blood loss, abnormal bruising or petechiae.  No fever, chills, n/v, cough, rash, dizziness, SOB, chest pain, abdominal pain/bloating or  changes in bowel or bladder habits.  No swelling, tenderness, numbness or tingling in her extremities.  No falls or syncope reported.  Appetite and hydration are good. Weight is described as stable at 154 lbs.  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   ROS: All other 10 point review of systems is negative.   PAST MEDICAL HISTORY:   Past Medical History:  Diagnosis Date   Anxiety    GERD (gastroesophageal reflux disease) 09/24/2020   High cholesterol    Hypertension    Weight loss 09/24/2020    ALLERGIES: No Known Allergies    MEDICATIONS:  Current Outpatient Medications on File Prior to Visit  Medication Sig Dispense Refill   hydrOXYzine  (ATARAX ) 25 MG tablet Take 1 tablet (25 mg total) by mouth every 8 (eight) hours as needed for anxiety. (Patient taking differently: Take 6.25 mg by mouth every 8 (eight) hours as needed for anxiety. 1/4 tablet prn) 30 tablet 2   loratadine (CLARITIN) 10 MG tablet Take 10 mg by mouth daily.     metoprolol  tartrate (LOPRESSOR ) 25 MG tablet TAKE 1 TABLET BY MOUTH TWICE DAILY 180 tablet 1   rosuvastatin (CRESTOR) 10 MG tablet Take 10 mg by mouth daily.     No current facility-administered medications on file prior to visit.     PAST SURGICAL HISTORY Past Surgical History:  Procedure Laterality Date   BIOPSY  06/17/2020   Procedure: BIOPSY;  Surgeon: Golda Claudis PENNER, MD;  [facility];  Service: Endoscopy;;   COLONOSCOPY N/A 06/17/2020   Procedure: COLONOSCOPY;  Surgeon: Golda Claudis PENNER, MD;  [facility];  Service: Endoscopy;  Laterality: N/A;  955   ESOPHAGOGASTRODUODENOSCOPY N/A 06/17/2020   Procedure: ESOPHAGOGASTRODUODENOSCOPY (EGD);  Surgeon: Golda Claudis PENNER, MD;  [facility];  Service: Endoscopy;  Laterality: N/A;   POLYPECTOMY  06/17/2020   Procedure: POLYPECTOMY;  Surgeon: Golda Claudis PENNER, MD;  [facility];  Service: Endoscopy;;   SHOULDER EXAMINAITON UNDER ANESTHESIA W/  INJECTION Left    VAGINAL HYSTERECTOMY      FAMILY HISTORY: Family History  Problem Relation Age of Onset   CAD Mother    Cancer Mother    Diabetes Father    Stroke Father     SOCIAL HISTORY:  reports that she has never smoked. She has been exposed to tobacco smoke. She has never used smokeless tobacco. She reports that she does not drink alcohol and does not use drugs.  PERFORMANCE STATUS: The patient's performance status is 1 - Symptomatic but completely ambulatory  PHYSICAL EXAM: Most Recent Vital Signs: Blood pressure (!) 145/72, pulse 63, temperature 97.8 F (36.6 C), temperature source Oral, resp. rate 16, height 5' 3 (1.6 m), weight 154 lb 12.2 oz (70.2 kg), SpO2 100%. BP (!) 145/72 (BP Location: Right Arm, Patient Position: Sitting)   Pulse 63   Temp 97.8 F (36.6 C) (Oral)   Resp 16   Ht 5' 3 (1.6 m)   Wt 154 lb 12.2 oz (70.2 kg)   SpO2 100%   BMI 27.42 kg/m   General Appearance:    Alert, cooperative, no distress, appears stated age  Head:    Normocephalic, without obvious abnormality, atraumatic  Eyes:    PERRL, conjunctiva/corneas clear, EOM's intact, fundi    benign, both eyes        Throat:   Lips, mucosa, and tongue normal; teeth and gums normal  Neck:   Supple, symmetrical, trachea midline, no adenopathy;    thyroid :  no enlargement/tenderness/nodules; no carotid   bruit or JVD  Back:     Symmetric, no curvature, ROM normal, no CVA tenderness  Lungs:     Clear to auscultation bilaterally, respirations unlabored  Chest Wall:    No tenderness or deformity   Heart:    Regular rate and rhythm, S1 and S2 normal, no murmur, rub   or gallop     Abdomen:     Soft, non-tender, bowel sounds active all four quadrants,    no masses, no organomegaly        Extremities:   Extremities normal, atraumatic, no cyanosis or edema     Skin:   Skin color, texture, turgor normal, no rashes or lesions  Lymph nodes:   Cervical, supraclavicular, and axillary nodes  normal       LABORATORY DATA:  No results found for this or any previous visit (from the past 48 hours).    RADIOGRAPHY: No results found.     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  Iron saturation is 27% and ferritin 175. We will proceed with 1 phlebotomy at this time for ferritin level.  JAK 2 is pending.  She will notify us  of the results of her sleep study.  Follow-up and lab in 6 weeks.  All questions were answered. The patient knows to call the clinic with any problems, questions or concerns. We can certainly see the patient much sooner if necessary.  The patient was discussed with Dr. Timmy and he is in agreement with the aforementioned.   Lauraine Pepper, NP

## 2024-11-20 NOTE — Progress Notes (Signed)
 Orders placed for labs

## 2024-11-21 LAB — ERYTHROPOIETIN: Erythropoietin: 5.6 m[IU]/mL (ref 2.6–18.5)

## 2024-11-23 LAB — HEMOCHROMATOSIS DNA-PCR(C282Y,H63D)

## 2024-11-27 ENCOUNTER — Encounter: Payer: Self-pay | Admitting: Family

## 2024-11-27 ENCOUNTER — Telehealth: Payer: Self-pay | Admitting: Family

## 2024-11-27 NOTE — Telephone Encounter (Signed)
 I was able to speak with Haley Morrow and go over her Hemochromatosis DNA and iron studies results. We will do 1 phlebotomy for ferritin > 100. Goal is to keep iron saturation < 50% and ferritin < 100. Patient in agreement with the plan. JAK 2 is still pending.  Follow-up and repeat labs in 6 weeks. No questions at this time. Patient appreciative of call.

## 2024-12-03 LAB — NGS JAK2 E12-15/CALR/MPL

## 2024-12-04 ENCOUNTER — Inpatient Hospital Stay

## 2024-12-04 DIAGNOSIS — D751 Secondary polycythemia: Secondary | ICD-10-CM | POA: Diagnosis not present

## 2024-12-04 NOTE — Patient Instructions (Signed)
 [facility] [place] - A DEPT OF [facility]  Discharge Instructions: Thank you for choosing [facility] to provide your oncology and hematology care.  If you have a lab appointment with the Cancer Center - please note that after April 8th, 2024, all labs will be drawn in the cancer center.  You do not have to check in or register with the main entrance as you have in the past but will complete your check-in in the cancer center.  Wear comfortable clothing and clothing appropriate for easy access to any Portacath or PICC line.   We strive to give you quality time with your provider. You may need to reschedule your appointment if you arrive late (15 or more minutes).  Arriving late affects you and other patients whose appointments are after yours.  Also, if you miss three or more appointments without notifying the office, you may be dismissed from the clinic at the provider's discretion.      For prescription refill requests, have your pharmacy contact our office and allow 72 hours for refills to be completed.    Today you received the following chemotherapy and/or immunotherapy agents Phlebotomy      To help prevent nausea and vomiting after your treatment, we encourage you to take your nausea medication as directed.  BELOW ARE SYMPTOMS THAT SHOULD BE REPORTED IMMEDIATELY: *FEVER GREATER THAN 100.4 F (38 C) OR HIGHER *CHILLS OR SWEATING *NAUSEA AND VOMITING THAT IS NOT CONTROLLED WITH YOUR NAUSEA MEDICATION *UNUSUAL SHORTNESS OF BREATH *UNUSUAL BRUISING OR BLEEDING *URINARY PROBLEMS (pain or burning when urinating, or frequent urination) *BOWEL PROBLEMS (unusual diarrhea, constipation, pain near the anus) TENDERNESS IN MOUTH AND THROAT WITH OR WITHOUT PRESENCE OF ULCERS (sore throat, sores in mouth, or a toothache) UNUSUAL RASH, SWELLING OR PAIN  UNUSUAL VAGINAL DISCHARGE OR ITCHING   Items with * indicate a potential emergency and should be followed  up as soon as possible or go to the Emergency Department if any problems should occur.  Please show the CHEMOTHERAPY ALERT CARD or IMMUNOTHERAPY ALERT CARD at check-in to the Emergency Department and triage nurse.  Should you have questions after your visit or need to cancel or reschedule your appointment, please contact [facility] CAN[facility]ER [facility] [place] - A DEPT OF [facility] [phone]  and follow the prompts.  Office hours are 8:00 a.m. to 4:30 p.m. Monday - Friday. Please note that voicemails left after 4:00 p.m. may not be returned until the following business day.  We are closed weekends and major holidays. You have access to a nurse at all times for urgent questions. Please call the main number to the clinic [phone] and follow the prompts.  For any non-urgent questions, you may also contact your provider using MyChart. We now offer e-Visits for anyone 11 and older to request care online for non-urgent symptoms. For details visit [email or web address].   Also download the MyChart app! Go to the app store, search "MyChart", open the app, select [place], and log in with your MyChart username and password.

## 2024-12-04 NOTE — Progress Notes (Signed)
 Patient presents today for phlebotomy per MD orders. Phlebotomy procedure started at 1505 and ended at 1515. 500 cc removed. Vital signs stable. Procedure tolerated well and without incident. Discharged ambulatory in stable condition.

## 2024-12-08 NOTE — Telephone Encounter (Signed)
 I was able to speak with the patient and let her know that her JAK 2 test was negative. NO questions or concerns at this time. Patient appreciative of call.

## 2024-12-29 ENCOUNTER — Other Ambulatory Visit: Payer: Self-pay

## 2024-12-29 DIAGNOSIS — D751 Secondary polycythemia: Secondary | ICD-10-CM

## 2025-01-01 ENCOUNTER — Inpatient Hospital Stay: Attending: Family

## 2025-01-01 DIAGNOSIS — D751 Secondary polycythemia: Secondary | ICD-10-CM | POA: Diagnosis present

## 2025-01-01 DIAGNOSIS — Z79899 Other long term (current) drug therapy: Secondary | ICD-10-CM | POA: Insufficient documentation

## 2025-01-01 DIAGNOSIS — G4733 Obstructive sleep apnea (adult) (pediatric): Secondary | ICD-10-CM | POA: Insufficient documentation

## 2025-01-01 LAB — COMPREHENSIVE METABOLIC PANEL WITH GFR
ALT: 8 U/L (ref 0–44)
AST: 24 U/L (ref 15–41)
Albumin: 4.6 g/dL (ref 3.5–5.0)
Alkaline Phosphatase: 77 U/L (ref 38–126)
Anion gap: 15 (ref 5–15)
BUN: 7 mg/dL — ABNORMAL LOW (ref 8–23)
CO2: 24 mmol/L (ref 22–32)
Calcium: 9.4 mg/dL (ref 8.9–10.3)
Chloride: 105 mmol/L (ref 98–111)
Creatinine, Ser: 0.7 mg/dL (ref 0.44–1.00)
GFR, Estimated: >60 mL/min
Glucose, Bld: 105 mg/dL — ABNORMAL HIGH (ref 70–99)
Potassium: 4.4 mmol/L (ref 3.5–5.1)
Sodium: 144 mmol/L (ref 135–145)
Total Bilirubin: 0.5 mg/dL (ref 0.0–1.2)
Total Protein: 7.4 g/dL (ref 6.5–8.1)

## 2025-01-01 LAB — CBC WITH DIFFERENTIAL/PLATELET
Abs Immature Granulocytes: 0.01 K/uL (ref 0.00–0.07)
Basophils Absolute: 0.1 K/uL (ref 0.0–0.1)
Basophils Relative: 1 %
Eosinophils Absolute: 0 K/uL (ref 0.0–0.5)
Eosinophils Relative: 0 %
HCT: 44.2 % (ref 36.0–46.0)
Hemoglobin: 15.2 g/dL — ABNORMAL HIGH (ref 12.0–15.0)
Immature Granulocytes: 0 %
Lymphocytes Relative: 23 %
Lymphs Abs: 1.7 K/uL (ref 0.7–4.0)
MCH: 32.4 pg (ref 26.0–34.0)
MCHC: 34.4 g/dL (ref 30.0–36.0)
MCV: 94.2 fL (ref 80.0–100.0)
Monocytes Absolute: 0.4 K/uL (ref 0.1–1.0)
Monocytes Relative: 6 %
Neutro Abs: 5.4 K/uL (ref 1.7–7.7)
Neutrophils Relative %: 70 %
Platelets: 278 K/uL (ref 150–400)
RBC: 4.69 MIL/uL (ref 3.87–5.11)
RDW: 12.5 % (ref 11.5–15.5)
WBC: 7.6 K/uL (ref 4.0–10.5)
nRBC: 0 % (ref 0.0–0.2)

## 2025-01-05 ENCOUNTER — Other Ambulatory Visit: Payer: Self-pay | Admitting: Family

## 2025-01-08 ENCOUNTER — Inpatient Hospital Stay

## 2025-01-08 ENCOUNTER — Inpatient Hospital Stay: Admitting: Physician Assistant

## 2025-01-08 DIAGNOSIS — D751 Secondary polycythemia: Secondary | ICD-10-CM | POA: Diagnosis not present

## 2025-01-08 LAB — IRON AND TIBC
Iron: 72 ug/dL (ref 28–170)
Saturation Ratios: 21 % (ref 10.4–31.8)
TIBC: 340 ug/dL (ref 250–450)
UIBC: 269 ug/dL

## 2025-01-08 LAB — FERRITIN: Ferritin: 91 ng/mL (ref 11–307)

## 2025-01-08 NOTE — Progress Notes (Signed)
 " [facility]  PROGRESS NOTE  Patient Care Team: Shona Norleen PEDLAR, MD as PCP - General (Internal Medicine) Mallipeddi, Diannah SQUIBB, MD as PCP - Cardiology (Cardiology)   CHIEF COMPLAINTS/PURPOSE OF CONSULTATION:  Secondary polycythemia Heterozygous hereditary hemochromatosis  INTERVAL HISTORY:  Haley Morrow 69 y.o. female presents for follow-up for continued management of secondary polycythemia and hereditary hemochromatosis.  She was last seen by Lauraine Pepper, NP on 11/20/2024 for a initial consultation.  In the interim, she underwent phlebotomy x 1 and underwent sleep study confirming obstructive sleep apnea.   On exam today, Haley Morrow reports that after her phlebotomy, she had presyncopal symptoms later that evening.  She is uncertain if this was related to the phlebotomy versus hypoglycemia.  She reports her energy and appetite are otherwise stable.  She denies nausea, vomiting or abdominal pain.  She reports her blood pressure has been in the low end of normal after her phlebotomy starting end of December.  She did stop taking her metoprolol  as a result and blood pressures have improved.  She denies fevers, chills, night sweats, shortness of breath, chest pain or cough.  She has no other complaints.  Rest of 10 point ROS as below.  MEDICAL HISTORY:  Past Medical History:  Diagnosis Date   Anxiety    GERD (gastroesophageal reflux disease) 09/24/2020   High cholesterol    Hypertension    Weight loss 09/24/2020    SURGICAL HISTORY: Past Surgical History:  Procedure Laterality Date   BIOPSY  06/17/2020   Procedure: BIOPSY;  Surgeon: Golda Claudis PENNER, MD;  Location: [facility];  Service: Endoscopy;;   COLONOSCOPY N/A 06/17/2020   Procedure: COLONOSCOPY;  Surgeon: Golda Claudis PENNER, MD;  Location: [facility];  Service: Endoscopy;  Laterality: N/A;  955   ESOPHAGOGASTRODUODENOSCOPY N/A 06/17/2020   Procedure: ESOPHAGOGASTRODUODENOSCOPY (EGD);  Surgeon: Golda Claudis PENNER, MD;  Location: [facility];  Service: Endoscopy;  Laterality: N/A;   POLYPECTOMY  06/17/2020   Procedure: POLYPECTOMY;  Surgeon: Golda Claudis PENNER, MD;  Location: [facility];  Service: Endoscopy;;   SHOULDER EXAMINAITON UNDER ANESTHESIA W/ INJECTION Left    VAGINAL HYSTERECTOMY      SOCIAL HISTORY: Social History   Socioeconomic History   Marital status: Widowed    Spouse name: Not on file   Number of children: 3   Years of education: 44   Highest education level: Not on file  Occupational History   Not on file  Tobacco Use   Smoking status: Never    Passive exposure: Current   Smokeless tobacco: Never  Vaping Use   Vaping status: Never Used  Substance and Sexual Activity   Alcohol use: Never   Drug use: Never   Sexual activity: Not Currently    Partners: Male    Comment: married  Other Topics Concern   Not on file  Social History Narrative   Lives alone   Right handed   Caffeine use: Tea/coffee daily ( 2 each morning), no soda   Social Drivers of Health   Tobacco Use: Medium Risk (11/20/2024)   Patient History    Smoking Tobacco Use: Never    Smokeless Tobacco Use: Never    Passive Exposure: Current  Financial Resource Strain: Not on file  Food Insecurity: No Food Insecurity (11/20/2024)   Epic    Worried About Programme Researcher, Broadcasting/film/video in the Last Year: Never true    [employer] of Food in the Last Year:  Never true  Transportation Needs: No Transportation Needs (11/20/2024)   Epic    Lack of Transportation (Medical): No    Lack of Transportation (Non-Medical): No  Physical Activity: Not on file  Stress: Not on file  Social Connections: Not on file  Intimate Partner Violence: Not At Risk (11/20/2024)   Epic    Fear of Current or Ex-Partner: No    Emotionally Abused: No    Physically Abused: No    Sexually Abused: No  Depression (PHQ2-9): Low Risk (01/08/2025)   Depression (PHQ2-9)    PHQ-2 Score: 0  Alcohol Screen: Not on file  Housing: Low Risk (11/20/2024)    Epic    Unable to Pay for Housing in the Last Year: No    Number of Times Moved in the Last Year: 0    Homeless in the Last Year: No  Utilities: Not At Risk (11/20/2024)   Epic    Threatened with loss of utilities: No  Health Literacy: Not on file    FAMILY HISTORY: Family History  Problem Relation Age of Onset   CAD Mother    Cancer Mother    Diabetes Father    Stroke Father     ALLERGIES:  has no known allergies.  MEDICATIONS:  Current Outpatient Medications  Medication Sig Dispense Refill   hydrOXYzine  (ATARAX ) 25 MG tablet Take 1 tablet (25 mg total) by mouth every 8 (eight) hours as needed for anxiety. (Patient taking differently: Take 6.25 mg by mouth every 8 (eight) hours as needed for anxiety. 1/4 tablet prn) 30 tablet 2   loratadine (CLARITIN) 10 MG tablet Take 10 mg by mouth daily.     metoprolol  tartrate (LOPRESSOR ) 25 MG tablet TAKE 1 TABLET BY MOUTH TWICE DAILY 180 tablet 1   rosuvastatin (CRESTOR) 10 MG tablet Take 10 mg by mouth daily.     No current facility-administered medications for this visit.    REVIEW OF SYSTEMS:   Constitutional: ( - ) fevers, ( - )  chills , ( - ) night sweats Eyes: ( - ) blurriness of vision, ( - ) double vision, ( - ) watery eyes Ears, nose, mouth, throat, and face: ( - ) mucositis, ( - ) sore throat Respiratory: ( - ) cough, ( - ) dyspnea, ( - ) wheezes Cardiovascular: ( - ) palpitation, ( - ) chest discomfort, ( - ) lower extremity swelling Gastrointestinal:  ( - ) nausea, ( - ) heartburn, ( - ) change in bowel habits Skin: ( - ) abnormal skin rashes Lymphatics: ( - ) new lymphadenopathy, ( - ) easy bruising Neurological: ( - ) numbness, ( - ) tingling, ( - ) new weaknesses Behavioral/Psych: ( - ) mood change, ( - ) new changes  All other systems were reviewed with the patient and are negative.  PHYSICAL EXAMINATION: ECOG PERFORMANCE STATUS: 1 - Symptomatic but completely ambulatory  Vitals:   01/08/25 1419  BP: 131/75   Pulse: 80  Resp: 18  Temp: 98.2 F (36.8 C)  SpO2: 98%   Filed Weights   01/08/25 1419  Weight: 151 lb (68.5 kg)    GENERAL: well appearing female in NAD  SKIN: skin color, texture, turgor are normal, no rashes or significant lesions EYES: conjunctiva are pink and non-injected, sclera clear LUNGS: clear to auscultation and percussion with normal breathing effort HEART: regular rate & rhythm and no murmurs and no lower extremity edema Musculoskeletal: no cyanosis of digits and no clubbing  PSYCH: alert &  oriented x 3, fluent speech NEURO: no focal motor/sensory deficits  LABORATORY DATA:  I have reviewed the data as listed    Latest Ref Rng & Units 01/01/2025    1:51 PM 11/20/2024   11:39 AM 02/16/2022    2:41 AM  CBC  WBC 4.0 - 10.5 K/uL 7.6  6.3  8.2   Hemoglobin 12.0 - 15.0 g/dL 84.7  83.5  84.7   Hematocrit 36.0 - 46.0 % 44.2  47.7  43.4   Platelets 150 - 400 K/uL 278  242  237        Latest Ref Rng & Units 01/01/2025    1:51 PM 11/20/2024   11:34 AM 02/16/2022    2:41 AM  CMP  Glucose 70 - 99 mg/dL 894  95  876   BUN 8 - 23 mg/dL 7  13  14    Creatinine 0.44 - 1.00 mg/dL 9.29  9.25  9.34   Sodium 135 - 145 mmol/L 144  143  140   Potassium 3.5 - 5.1 mmol/L 4.4  5.2  3.4   Chloride 98 - 111 mmol/L 105  105  103   CO2 22 - 32 mmol/L 24  26  28    Calcium 8.9 - 10.3 mg/dL 9.4  9.9  9.6   Total Protein 6.5 - 8.1 g/dL 7.4  7.4  7.9   Total Bilirubin 0.0 - 1.2 mg/dL 0.5  0.5  0.9   Alkaline Phos 38 - 126 U/L 77  80  69   AST 15 - 41 U/L 24  19  17    ALT 0 - 44 U/L 8  12  13      ASSESSMENT & PLAN Haley Morrow is a 69 y.o. female who presents to the clinic for evaluation of secondary polycythemia and hereditary hemochromatosis.   #Polycythemia, secondary --workup from 11/20/2024 showed no evidence of JAK2/MPL/CALR mutation. Need to obtain BCR/ABL FISH at next visit.  --patient was recently diagnosed with slight to moderate OSA. Recommended for patient to discuss  interventions with her PCP including starting CPAP machine.  --Labs from 01/01/2025 showed Hgb 15.2, Hct 44.2% --No further intervention at this time  #Hereditary hemochromatosis --DNA testing detected heterozygous mutation of H63D gene.  --S/p therapeutic phlebotomy x 1 on 12/04/24.   --Ferritin levels are 91 today, no need for additional phlebotomies at this time  Follow up: --RTC in 4 months with repeat labs  No orders of the defined types were placed in this encounter.   All questions were answered. The patient knows to call the clinic with any problems, questions or concerns.  I have spent a total of 25 minutes minutes of face-to-face and non-face-to-face time, preparing to see the patient,  performing a medically appropriate examination, counseling and educating the patient, documenting clinical information in the electronic health record, independently interpreting results and communicating results to the patient, and care coordination.   Johnston Police, PA-C Department of Hematology/Oncology [facility] "

## 2025-01-10 ENCOUNTER — Telehealth: Payer: Self-pay | Admitting: *Deleted

## 2025-01-10 NOTE — Telephone Encounter (Signed)
 Per Romeo Snowman, NP, patient notified that ferritin is <100, no need for additional phlebotomies.  Verbalized understanding.

## 2025-01-31 ENCOUNTER — Ambulatory Visit: Admitting: Physician Assistant

## 2025-05-09 ENCOUNTER — Inpatient Hospital Stay

## 2025-05-16 ENCOUNTER — Inpatient Hospital Stay: Admitting: Oncology
